# Patient Record
Sex: Female | Born: 1980 | Race: White | Hispanic: No | Marital: Single | Smoking: Current every day smoker
Health system: Southern US, Community
[De-identification: ages and names within clinical notes are randomized; demographics above are authoritative.]

## PROBLEM LIST (undated history)

## (undated) DIAGNOSIS — A630 Anogenital (venereal) warts: Secondary | ICD-10-CM

## (undated) DIAGNOSIS — K219 Gastro-esophageal reflux disease without esophagitis: Secondary | ICD-10-CM

## (undated) DIAGNOSIS — F419 Anxiety disorder, unspecified: Secondary | ICD-10-CM

## (undated) DIAGNOSIS — E079 Disorder of thyroid, unspecified: Secondary | ICD-10-CM

## (undated) DIAGNOSIS — N200 Calculus of kidney: Secondary | ICD-10-CM

## (undated) DIAGNOSIS — B977 Papillomavirus as the cause of diseases classified elsewhere: Secondary | ICD-10-CM

## (undated) DIAGNOSIS — N390 Urinary tract infection, site not specified: Secondary | ICD-10-CM

## (undated) HISTORY — PX: TONSILECTOMY, ADENOIDECTOMY, BILATERAL MYRINGOTOMY AND TUBES: SHX2538

## (undated) HISTORY — PX: CHOLECYSTECTOMY: SHX55

## (undated) HISTORY — DX: Calculus of kidney: N20.0

## (undated) HISTORY — PX: LEEP: SHX91

## (undated) HISTORY — PX: INTRAUTERINE DEVICE INSERTION: SHX323

## (undated) HISTORY — DX: Gastro-esophageal reflux disease without esophagitis: K21.9

## (undated) HISTORY — DX: Urinary tract infection, site not specified: N39.0

## (undated) HISTORY — DX: Papillomavirus as the cause of diseases classified elsewhere: B97.7

## (undated) HISTORY — PX: OTHER SURGICAL HISTORY: SHX169

## (undated) HISTORY — DX: Disorder of thyroid, unspecified: E07.9

## (undated) HISTORY — DX: Anogenital (venereal) warts: A63.0

## (undated) HISTORY — DX: Anxiety disorder, unspecified: F41.9

---

## 1998-03-03 ENCOUNTER — Ambulatory Visit (HOSPITAL_COMMUNITY): Admission: RE | Admit: 1998-03-03 | Discharge: 1998-03-03 | Payer: Self-pay | Admitting: *Deleted

## 1998-11-30 ENCOUNTER — Emergency Department (HOSPITAL_COMMUNITY): Admission: EM | Admit: 1998-11-30 | Discharge: 1998-11-30 | Payer: Self-pay | Admitting: Emergency Medicine

## 2001-02-05 ENCOUNTER — Other Ambulatory Visit: Admission: RE | Admit: 2001-02-05 | Discharge: 2001-02-05 | Payer: Self-pay | Admitting: Gynecology

## 2003-12-18 ENCOUNTER — Emergency Department (HOSPITAL_COMMUNITY): Admission: EM | Admit: 2003-12-18 | Discharge: 2003-12-18 | Payer: Self-pay | Admitting: Emergency Medicine

## 2005-02-03 ENCOUNTER — Emergency Department (HOSPITAL_COMMUNITY): Admission: EM | Admit: 2005-02-03 | Discharge: 2005-02-03 | Payer: Self-pay | Admitting: Emergency Medicine

## 2005-11-10 ENCOUNTER — Emergency Department (HOSPITAL_COMMUNITY): Admission: EM | Admit: 2005-11-10 | Discharge: 2005-11-10 | Payer: Self-pay | Admitting: Emergency Medicine

## 2005-12-28 ENCOUNTER — Ambulatory Visit: Payer: Self-pay | Admitting: Otolaryngology

## 2006-04-23 ENCOUNTER — Emergency Department: Payer: Self-pay | Admitting: General Practice

## 2006-06-18 ENCOUNTER — Emergency Department: Payer: Self-pay | Admitting: Emergency Medicine

## 2006-08-06 ENCOUNTER — Emergency Department (HOSPITAL_COMMUNITY): Admission: EM | Admit: 2006-08-06 | Discharge: 2006-08-06 | Payer: Self-pay | Admitting: Emergency Medicine

## 2006-11-02 IMAGING — CT CT HEAD W/O CM
3 series · 17 of 47 positions shown, 20 images · IV contrast (agent unspecified)
Comparison: 12/18/03.

CLINICAL DATA: Headache, dizziness, ringing in ears. 
HEAD CT WITHOUT CONTRAST:
TECHNIQUE: Contiguous axial images were obtained from the base of the skull through the vertex according to standard protocol without contrast.

[Series 2: head_seq 4.5 h45s st · axial · 0.43mm/px · z∈[+1330,+1469]mm · 11 of 36 slices shown, 14 images]
[im 3/36  brain]
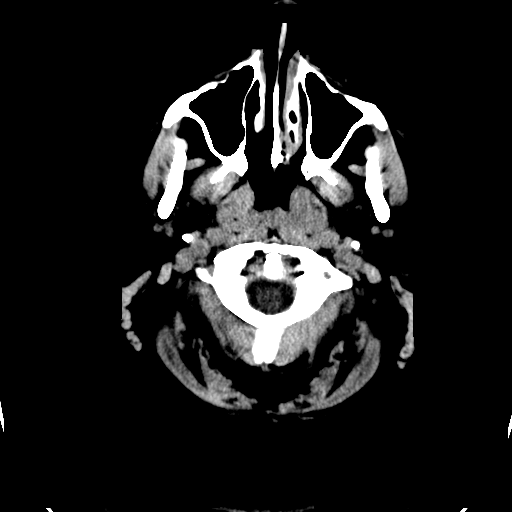
[im 3/36  bone]
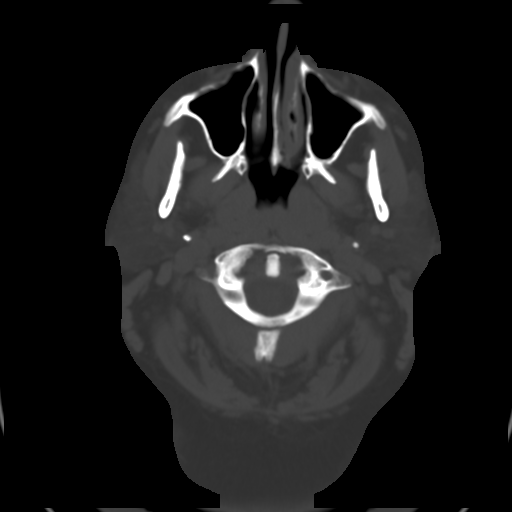
[im 5/36  brain]
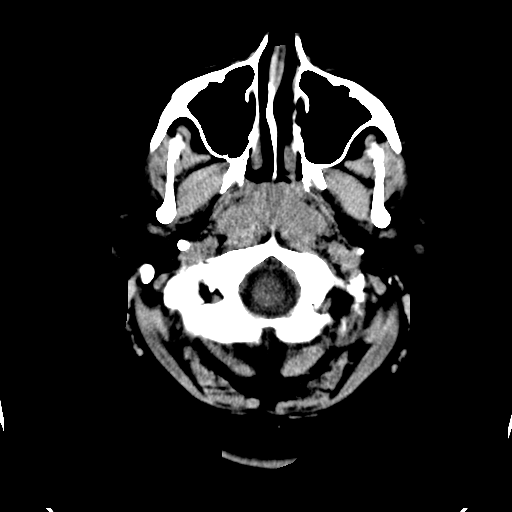
[im 9/36  brain]
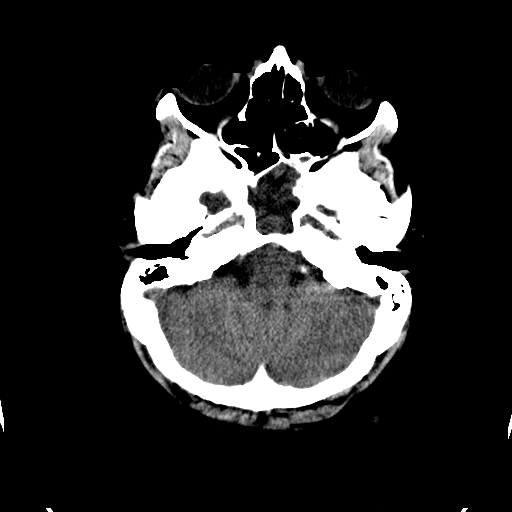
[im 11/36  brain]
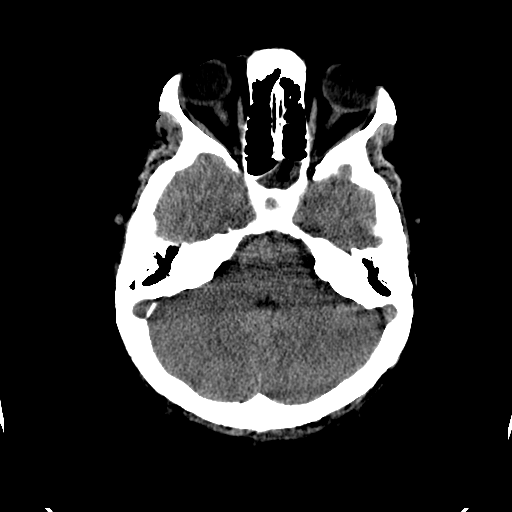
[im 15/36  brain]
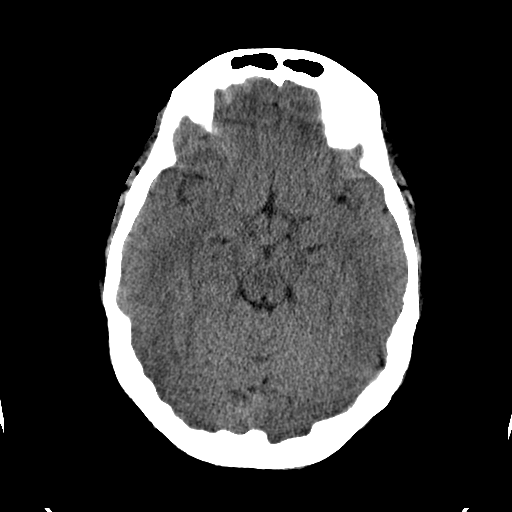
[im 15/36  bone]
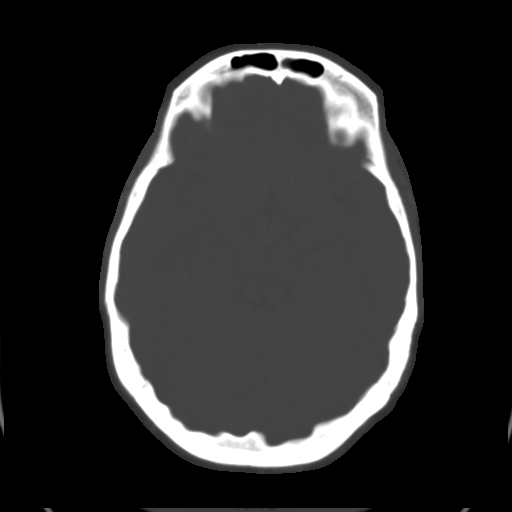
[im 19/36  brain]
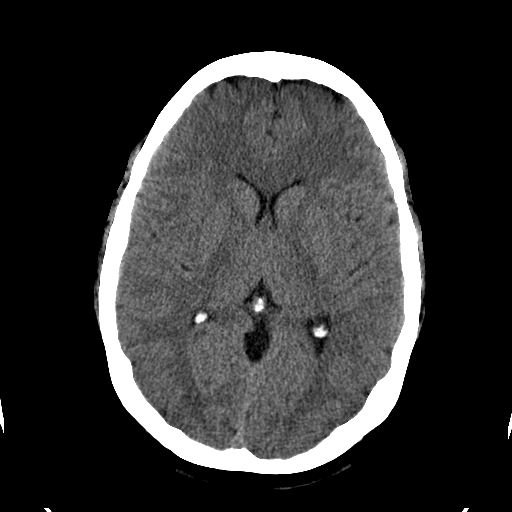
[im 21/36  brain]
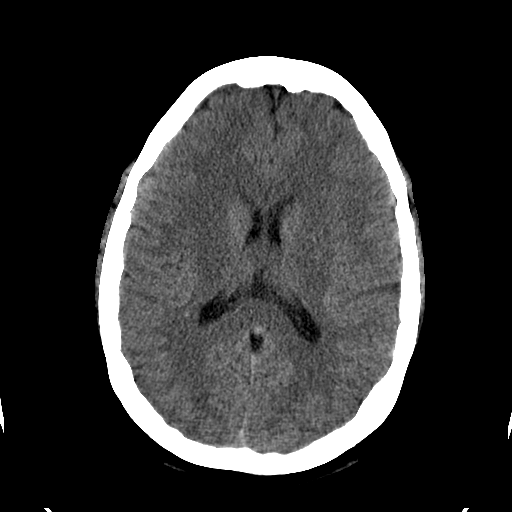
[im 25/36  brain]
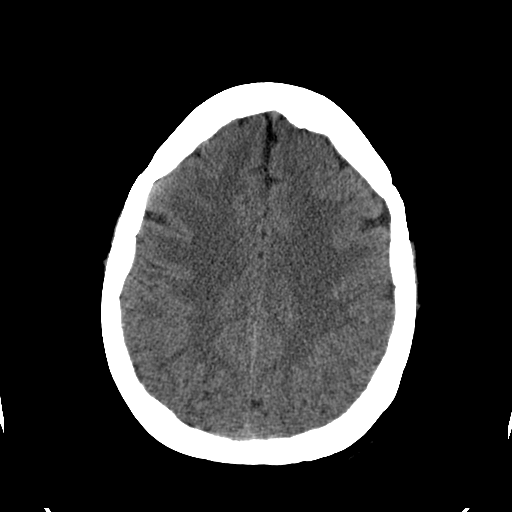
[im 27/36  brain]
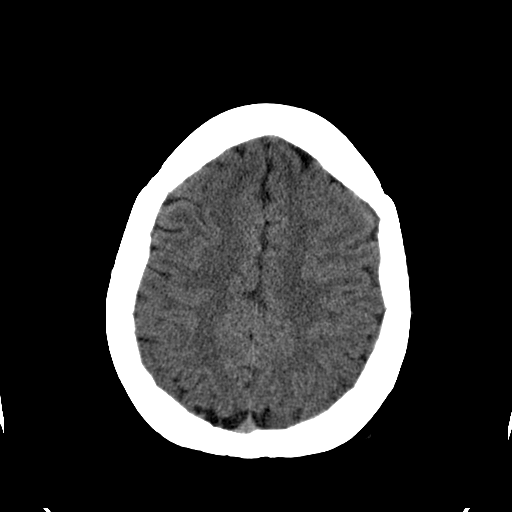
[im 27/36  bone]
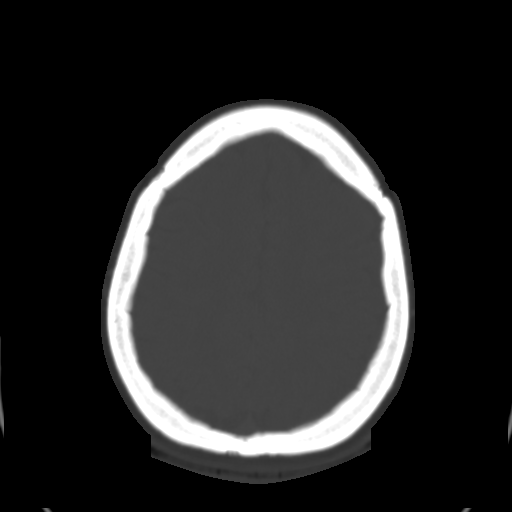
[im 31/36  brain]
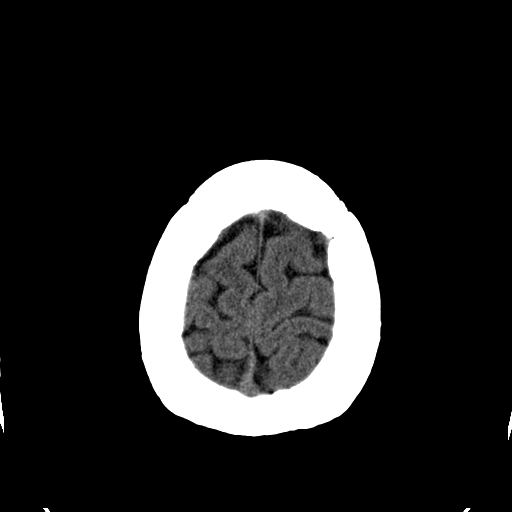
[im 33/36  brain]
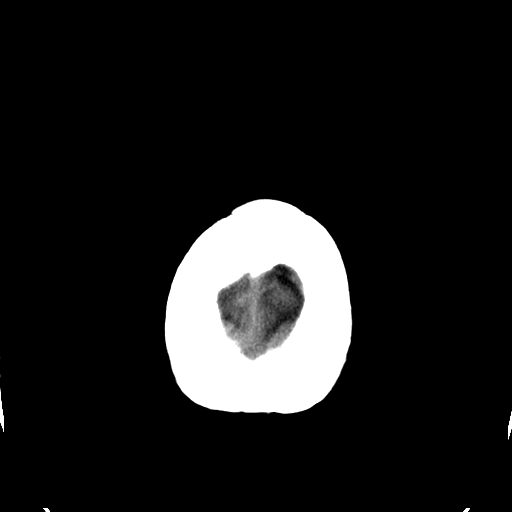

[Series 602: coronal · coronal · 0.51mm/px · 3 of 39 slices shown]
[im 13/39  brain]
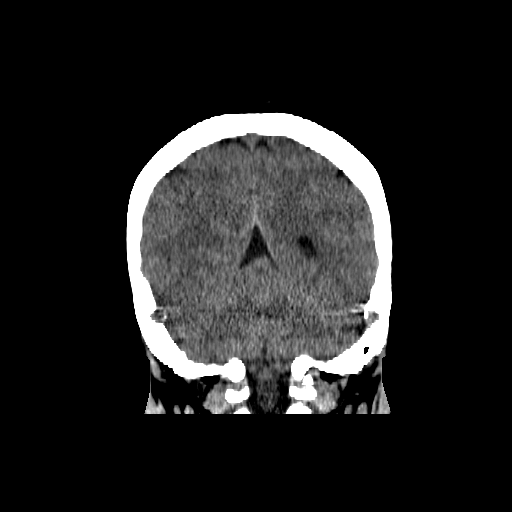
[im 17/39  brain]
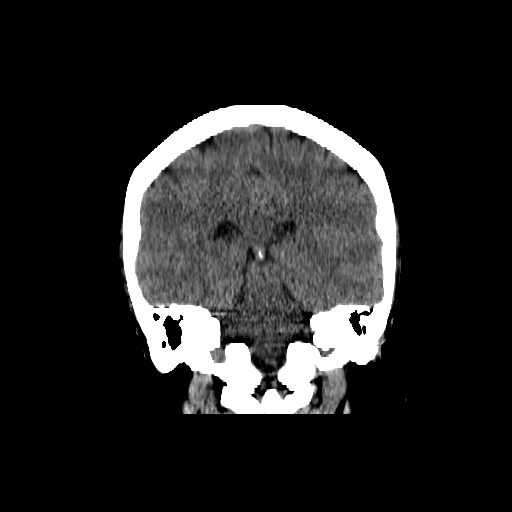
[im 22/39  brain]
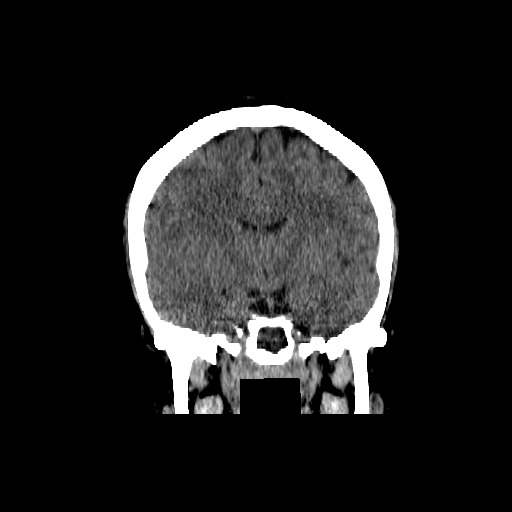

[Series 603: sagittal · sagittal · 0.51mm/px · 3 of 39 slices shown]
[im 13/39  brain]
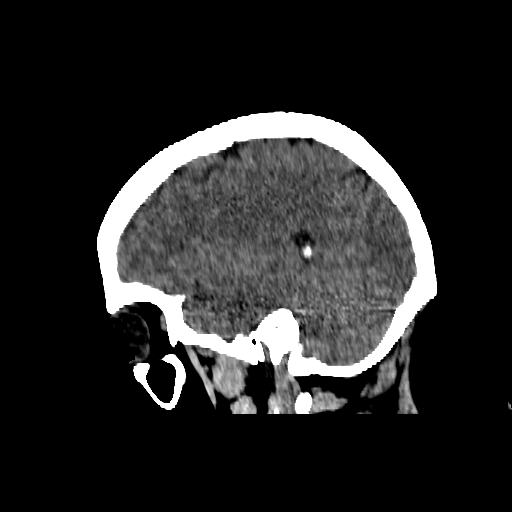
[im 20/39  brain]
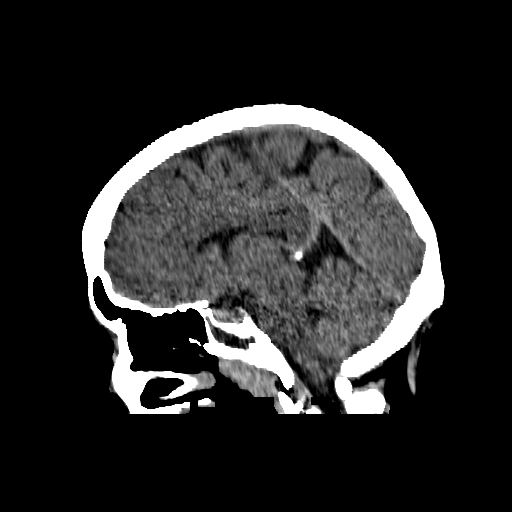
[im 26/39  brain]
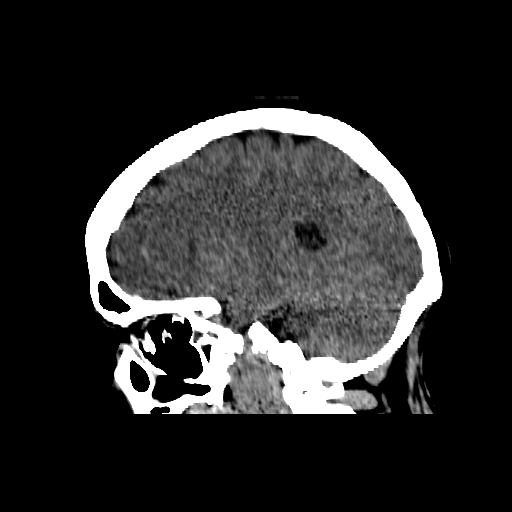

[17 of 47 positions shown; findings below may reference images not displayed]

FINDINGS: There is no evidence of intracranial abnormality including mass or mass effect, hydrocephalus, extraaxial fluid collection, midline shift, hemorrhage, or infarct.  Acute infarct may be missed by CT for 24 to 48 hours.   There has been interval near complete opacification and moderate mucosal thickening of the sphenoid sinus with fluid in the left middle and inner ears.
IMPRESSION: 1.  No evidence of acute intracranial abnormality. 
2.  Sphenoid sinusitis with fluid in the left middle and inner ears.

## 2007-04-15 IMAGING — CR RIGHT FOOT COMPLETE - 3+ VIEW
1 series · 3 of 3 positions shown · non-contrast
Comparison: none

REASON FOR EXAM: injury
COMMENTS:

[Series 1: view not recorded · 0.17mm/px · 3 of 3 slices shown]
[im 1/3]
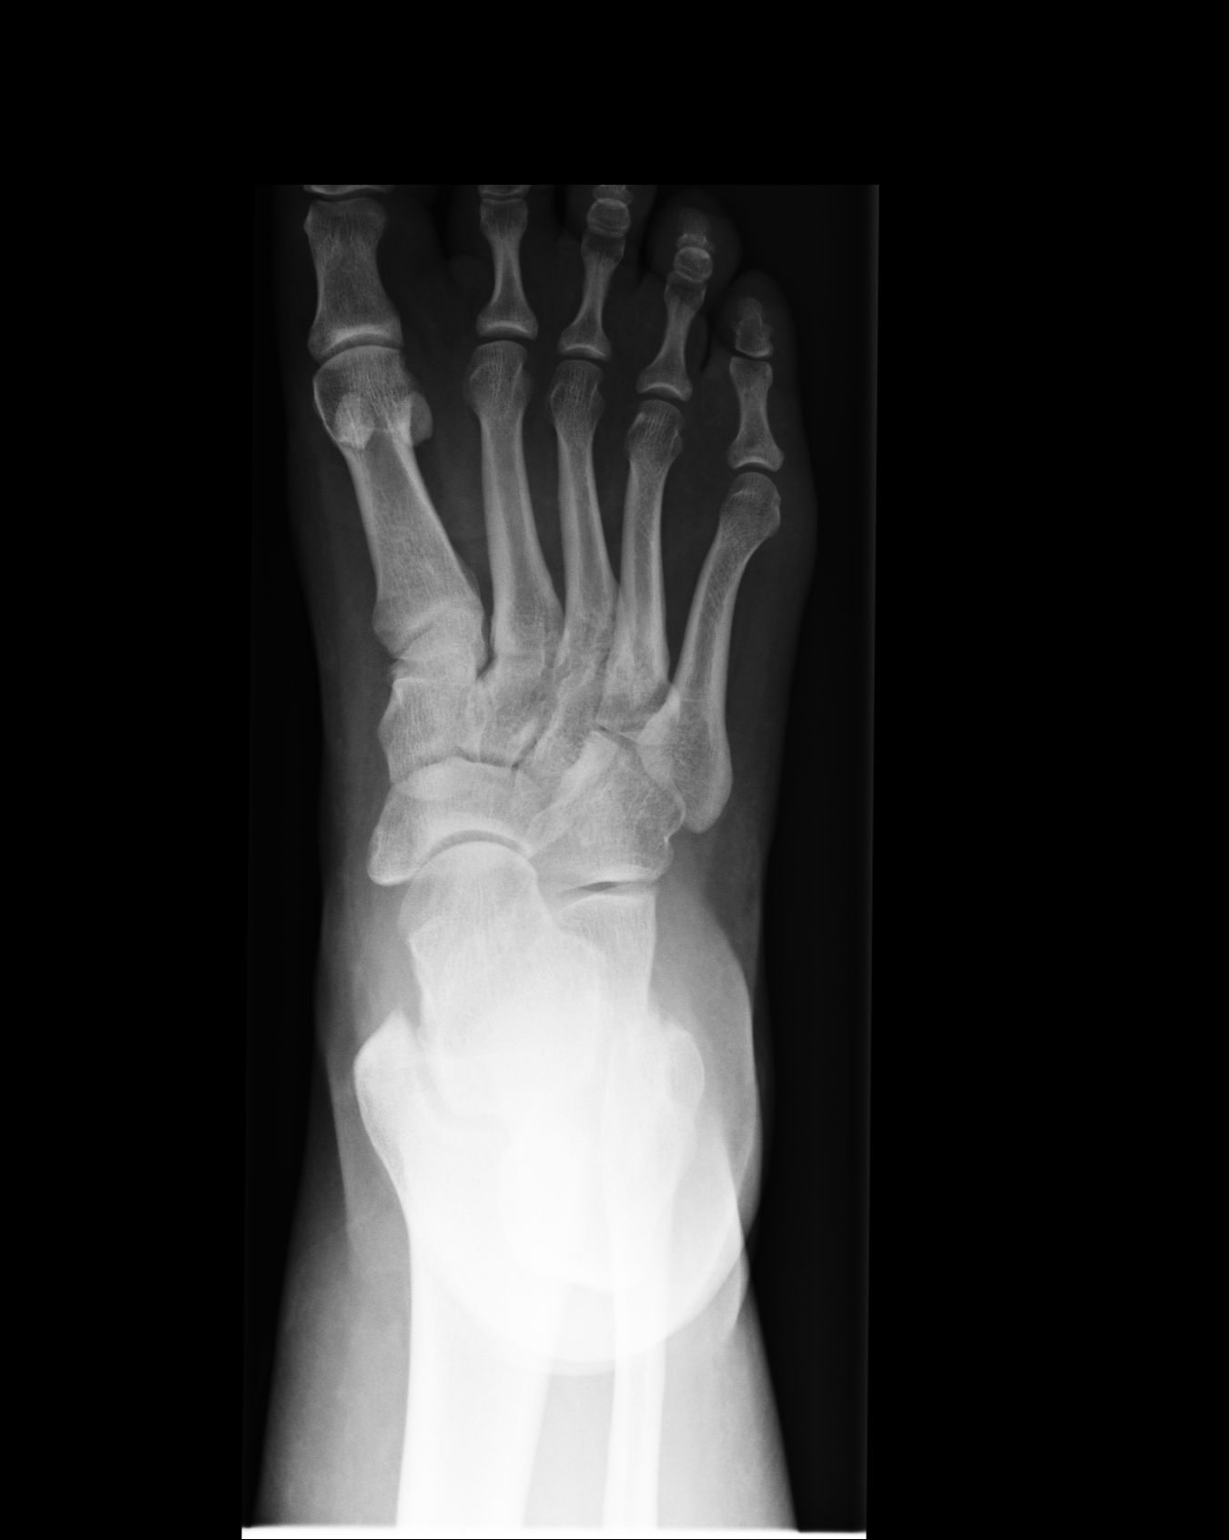
[im 2/3]
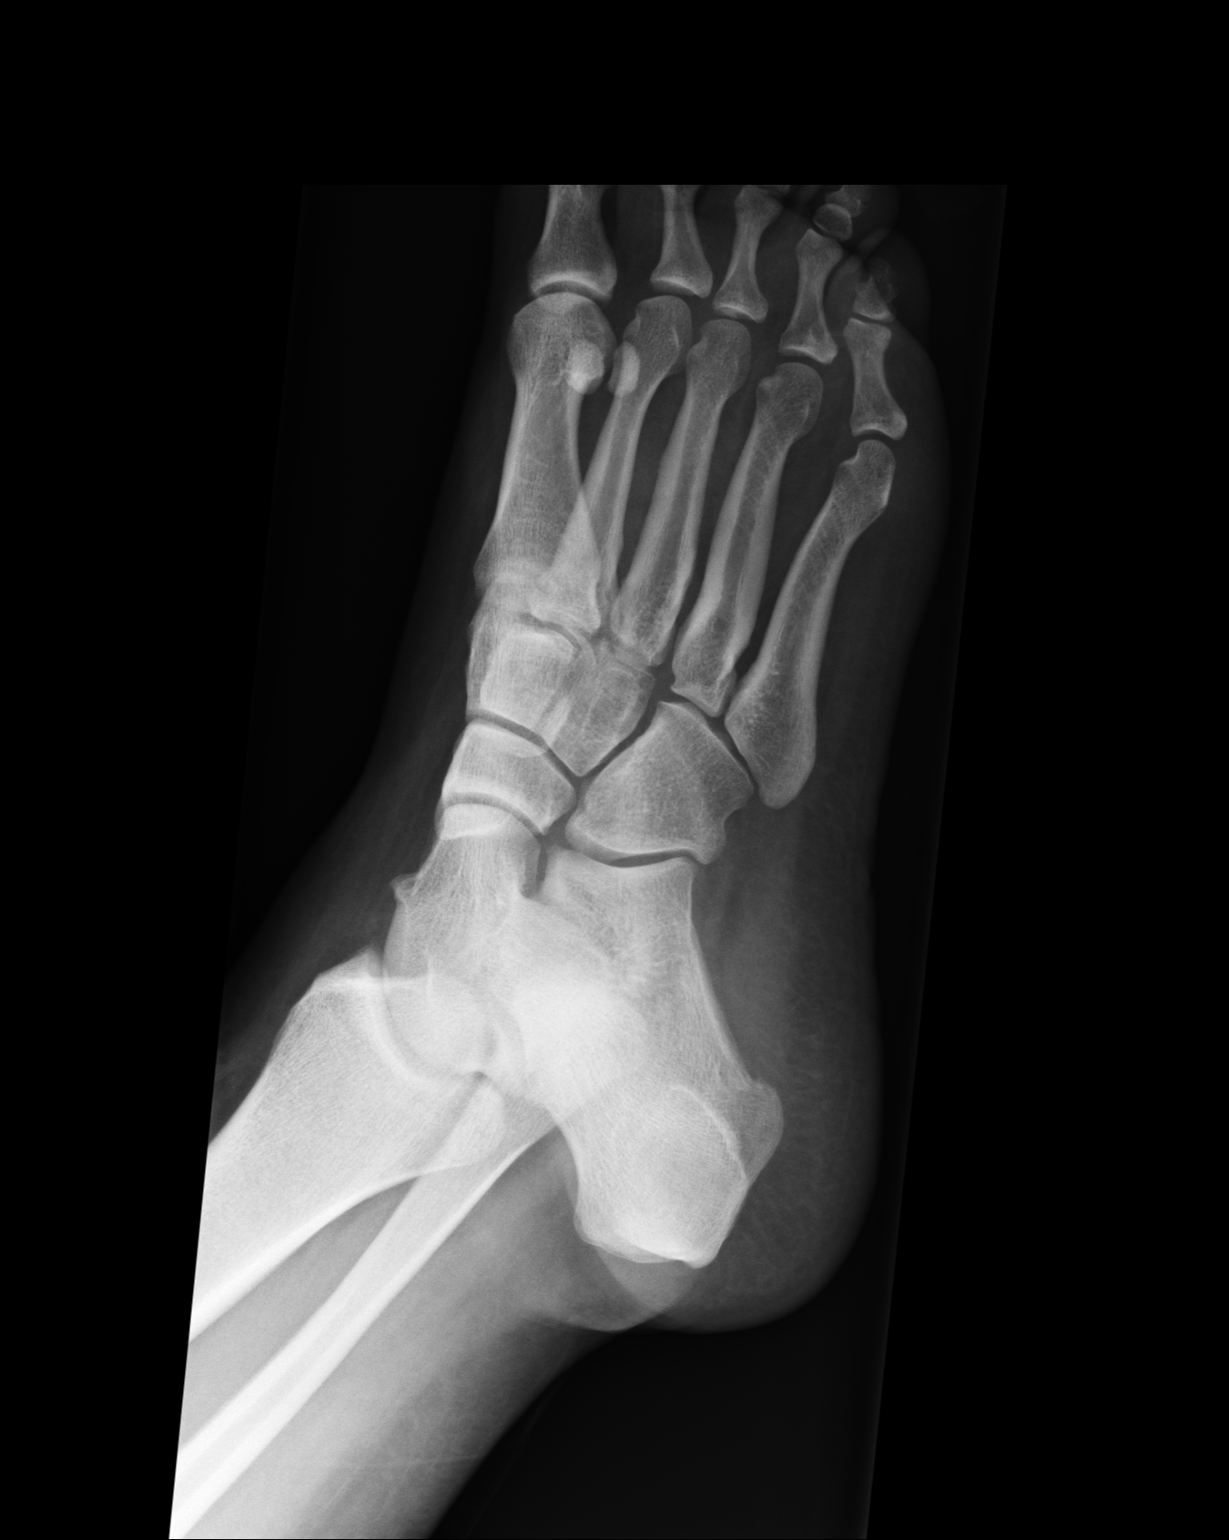
[im 3/3]
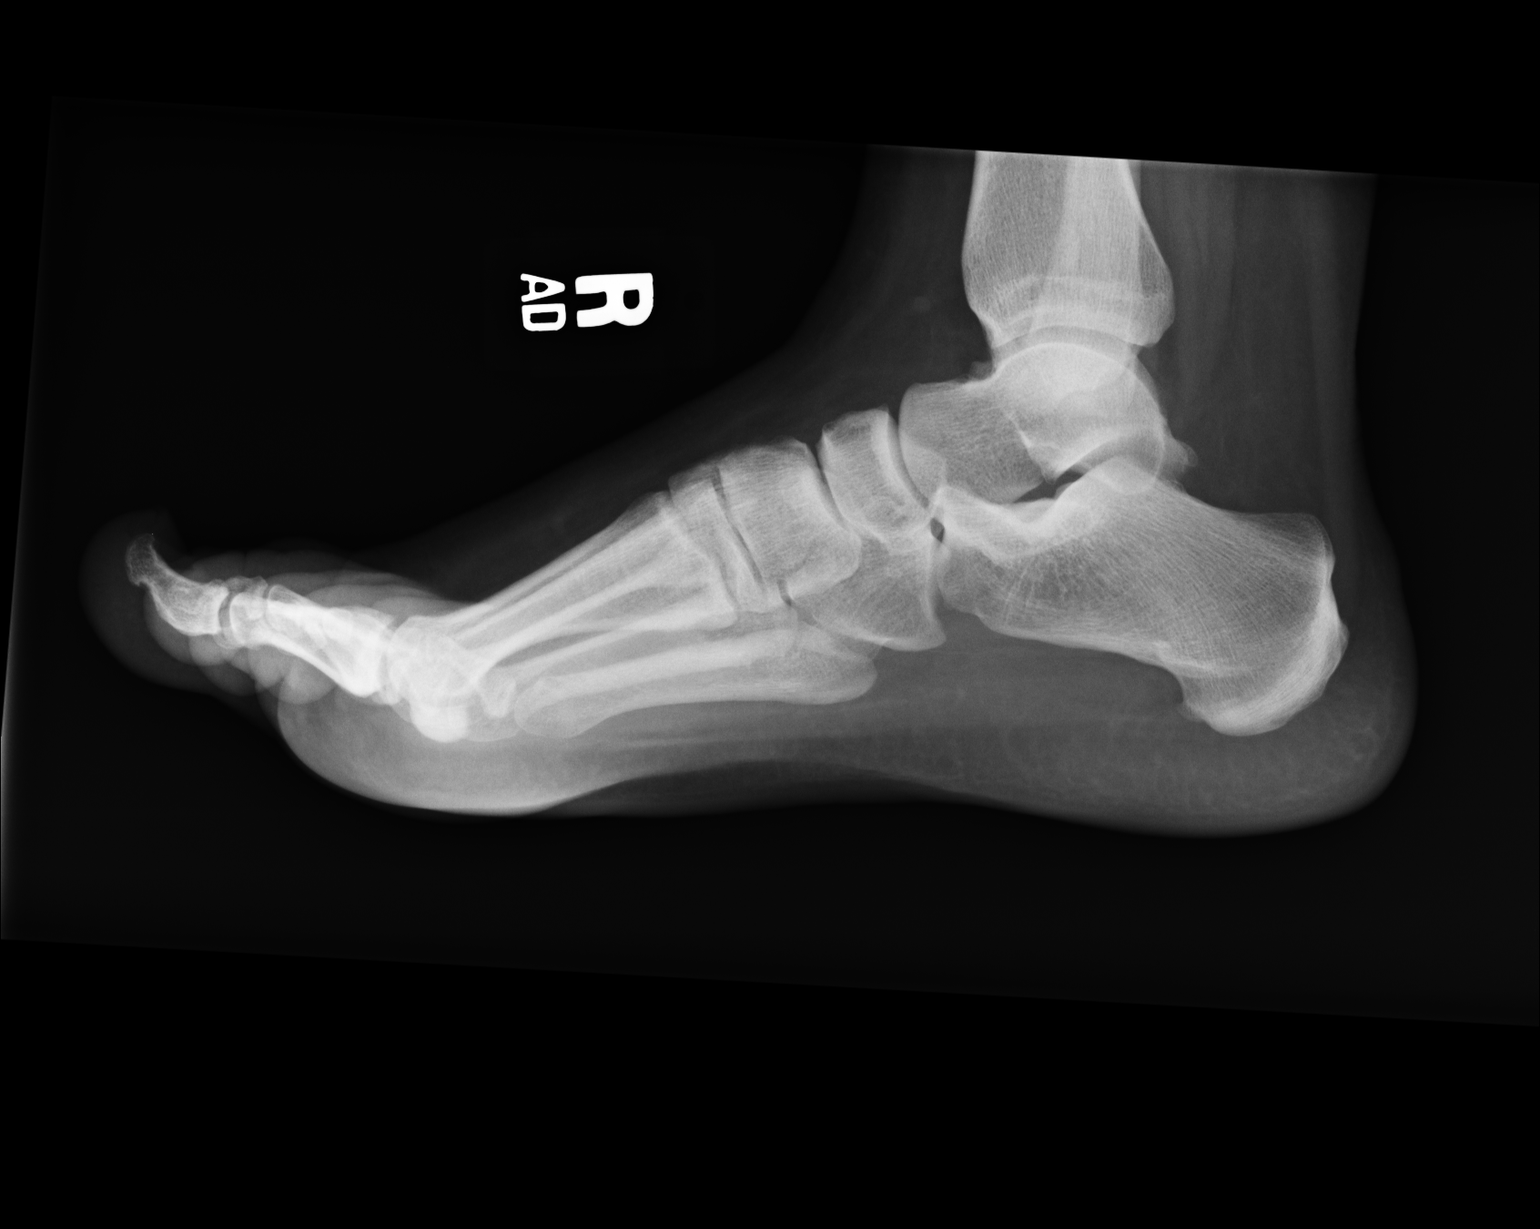

[3 of 3 positions shown; findings below may reference images not displayed]

PROCEDURE:     DXR - DXR FOOT RT COMPLETE W/OBLIQUES  - April 23, 2006  [DATE]

RESULT:          There does not appear to be evidence of a definitive
fracture or dislocation, though a radio occult fracture cannot be excluded.
A repeat evaluation can be obtained if clinically warranted, and if
clinically warranted, immobilization of the patient's foot is recommended.
There does appear to be an element of soft tissue swelling along the lateral
distal fifth metatarsal.
IMPRESSION: No evidence of a definitive fracture as described
above.  Repeat surveillance evaluation is recommended if clinically
warranted.

## 2007-07-29 IMAGING — CR DG CHEST 2V
2 series · 2 of 2 positions shown · non-contrast
Comparison: none

CLINICAL DATA: Chest pain.  
 CHEST - 2 VIEW:

[w chest pa]
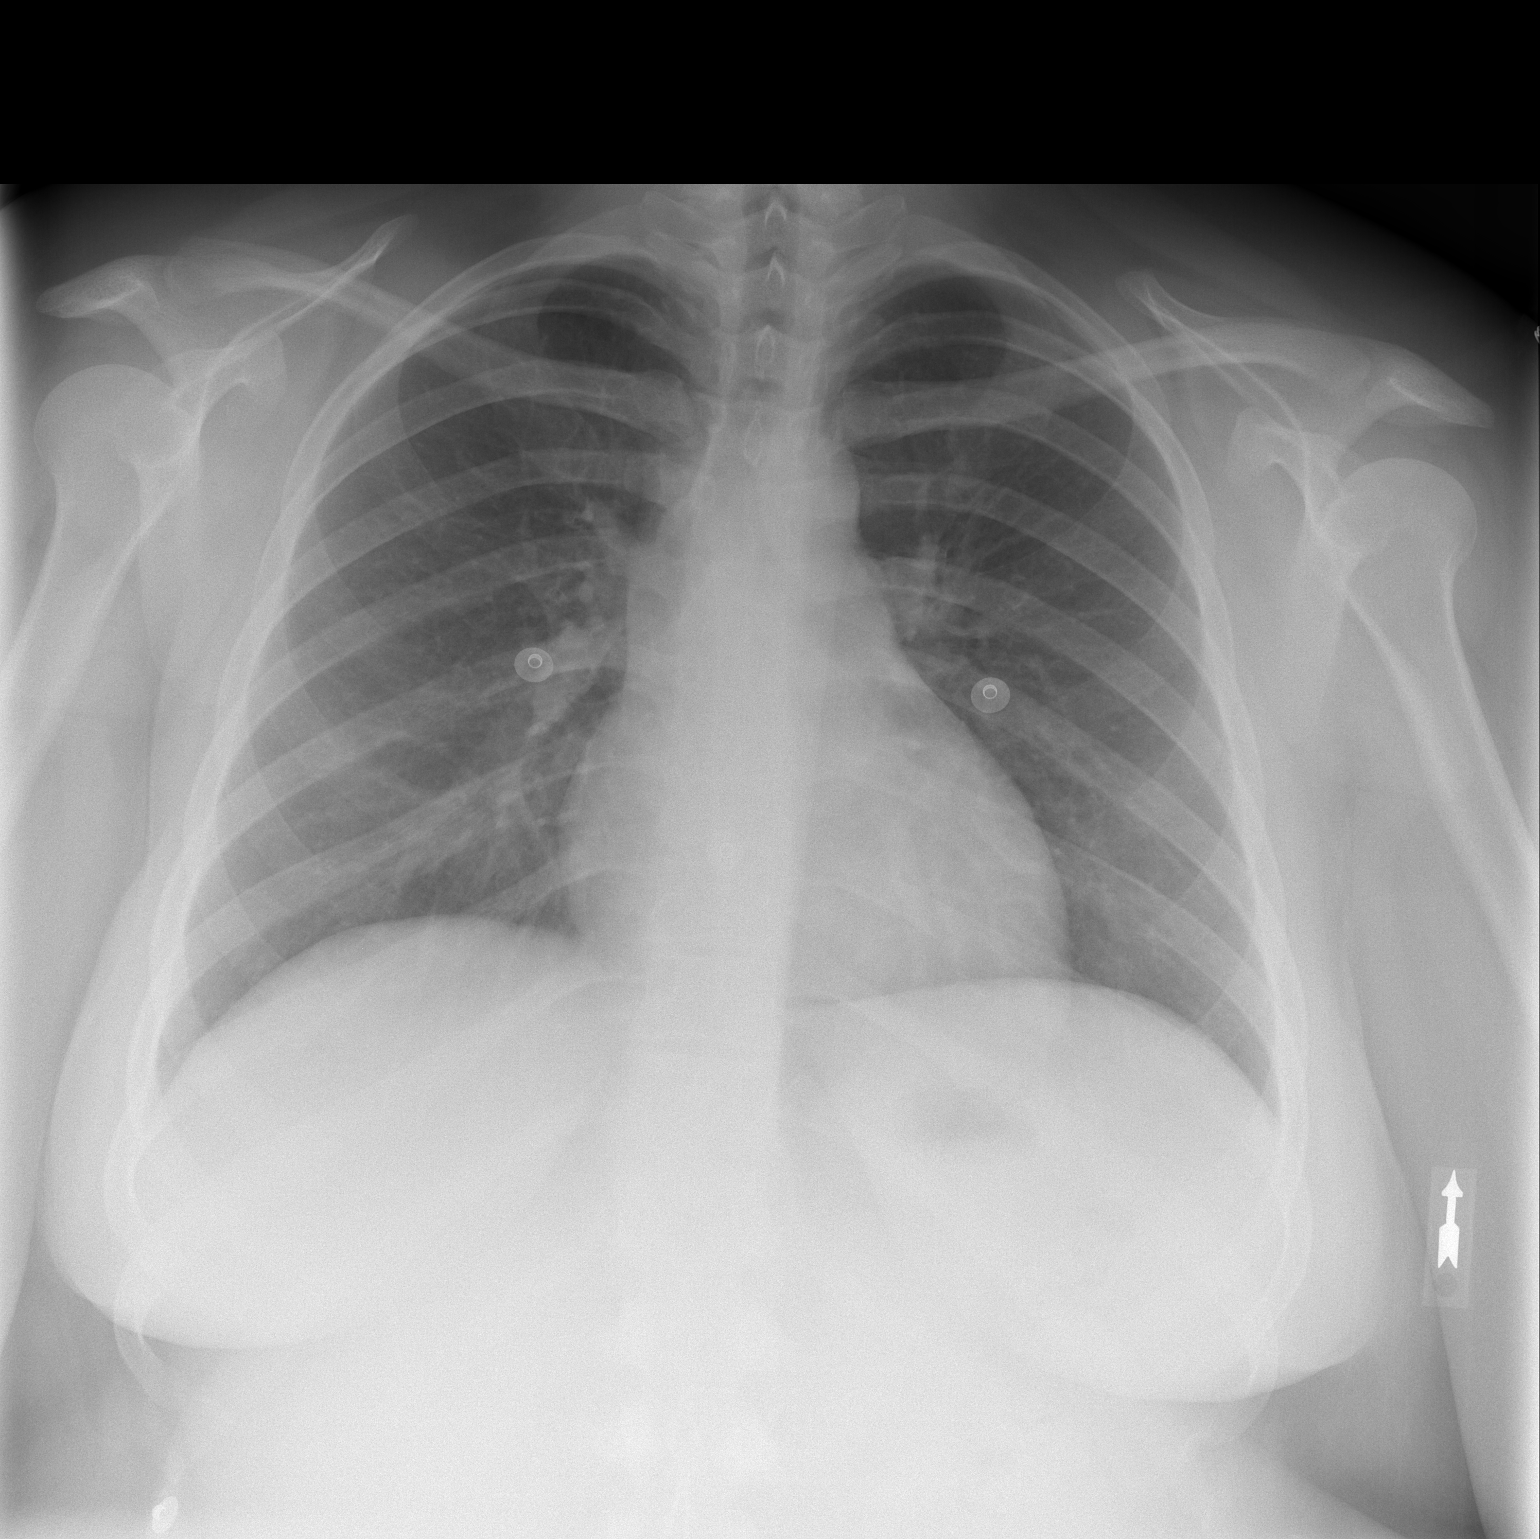

[w chest lat]
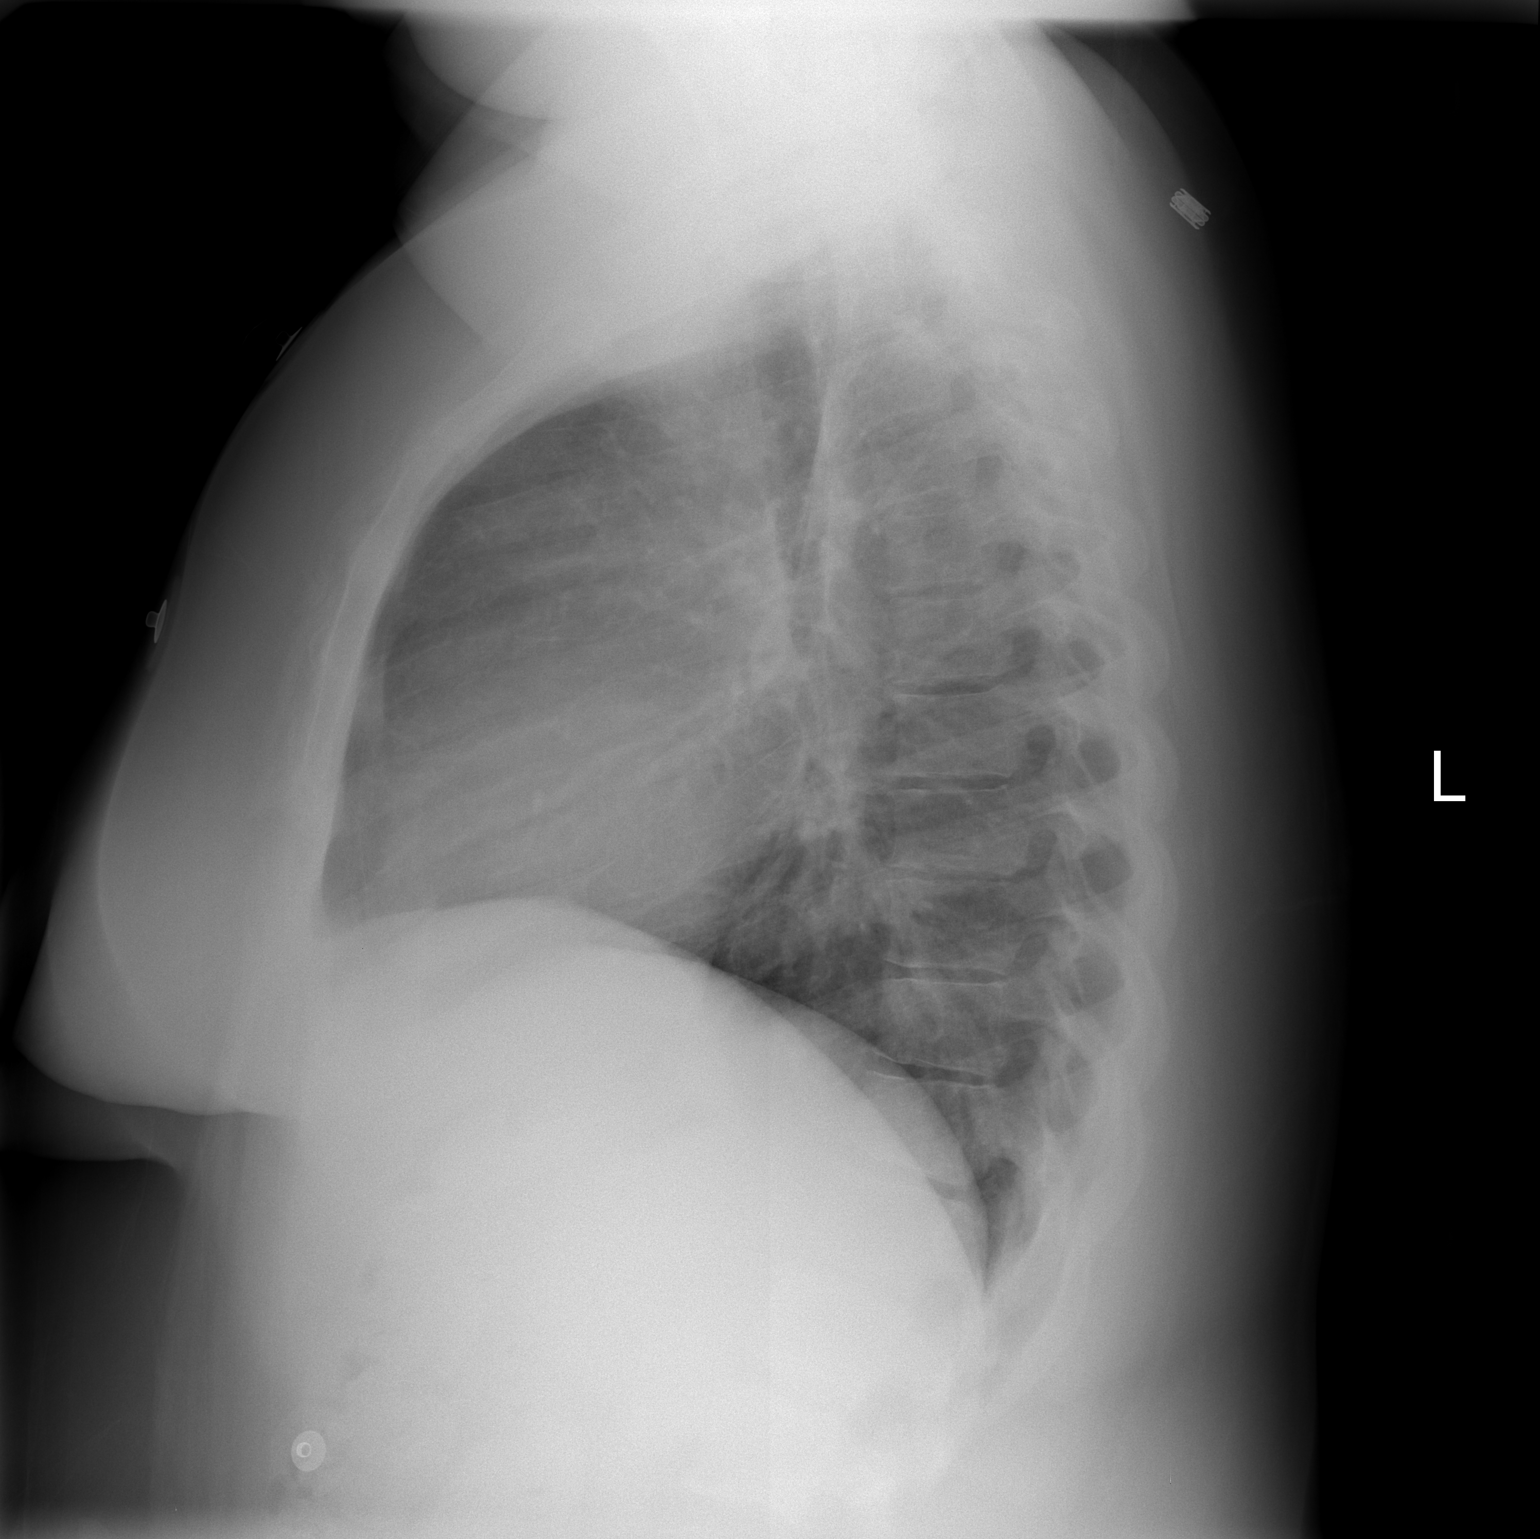

[2 of 2 positions shown; findings below may reference images not displayed]

FINDINGS: The heart size and mediastinal contours are within normal limits.  Both lungs are clear.  The visualized skeletal structures are unremarkable.
IMPRESSION: No active cardiopulmonary disease.

## 2008-01-27 ENCOUNTER — Emergency Department (HOSPITAL_COMMUNITY): Admission: EM | Admit: 2008-01-27 | Discharge: 2008-01-27 | Payer: Self-pay | Admitting: Family Medicine

## 2009-07-20 ENCOUNTER — Emergency Department (HOSPITAL_COMMUNITY): Admission: EM | Admit: 2009-07-20 | Discharge: 2009-07-20 | Payer: Self-pay | Admitting: Emergency Medicine

## 2009-09-14 ENCOUNTER — Other Ambulatory Visit: Admission: RE | Admit: 2009-09-14 | Discharge: 2009-09-14 | Payer: Self-pay | Admitting: Family Medicine

## 2010-02-23 ENCOUNTER — Other Ambulatory Visit: Admission: RE | Admit: 2010-02-23 | Discharge: 2010-02-23 | Payer: Self-pay | Admitting: Obstetrics and Gynecology

## 2011-08-08 LAB — POCT RAPID STREP A: Streptococcus, Group A Screen (Direct): NEGATIVE

## 2011-08-08 LAB — INFLUENZA A AND B ANTIGEN (CONVERTED LAB): Influenza B Ag: NEGATIVE

## 2016-02-09 ENCOUNTER — Encounter: Payer: Self-pay | Admitting: Family

## 2016-02-10 ENCOUNTER — Telehealth: Payer: Self-pay | Admitting: Family

## 2016-02-10 NOTE — Telephone Encounter (Signed)
Ok to work in sooner in any 30 minute spot.

## 2016-02-10 NOTE — Telephone Encounter (Signed)
Patient is trying to establish as new patient.  She is out of thyroid meds - synthroid  - 200mg  6 days a week and 400mg  1 day a week.  Patient states previous MD already gave her all the refills they can do.  Patient had appointment scheduled for tomorrow but had to reschedule because found out insurance does not start until April 1st.  Would like to know [facility] [address] or if she can be worked in sooner?

## 2016-02-11 ENCOUNTER — Ambulatory Visit: Payer: Self-pay | Admitting: Family

## 2016-02-12 NOTE — Telephone Encounter (Signed)
Got patient scheduled

## 2016-02-17 ENCOUNTER — Other Ambulatory Visit (INDEPENDENT_AMBULATORY_CARE_PROVIDER_SITE_OTHER): Payer: No Typology Code available for payment source

## 2016-02-17 ENCOUNTER — Encounter: Payer: Self-pay | Admitting: Family

## 2016-02-17 ENCOUNTER — Telehealth: Payer: Self-pay | Admitting: Family

## 2016-02-17 ENCOUNTER — Ambulatory Visit (INDEPENDENT_AMBULATORY_CARE_PROVIDER_SITE_OTHER): Payer: No Typology Code available for payment source | Admitting: Family

## 2016-02-17 VITALS — BP 114/70 | HR 79 | Temp 98.2°F | Resp 16 | Wt 239.0 lb

## 2016-02-17 DIAGNOSIS — K219 Gastro-esophageal reflux disease without esophagitis: Secondary | ICD-10-CM | POA: Insufficient documentation

## 2016-02-17 DIAGNOSIS — F909 Attention-deficit hyperactivity disorder, unspecified type: Secondary | ICD-10-CM | POA: Diagnosis not present

## 2016-02-17 DIAGNOSIS — E039 Hypothyroidism, unspecified: Secondary | ICD-10-CM | POA: Diagnosis not present

## 2016-02-17 DIAGNOSIS — F411 Generalized anxiety disorder: Secondary | ICD-10-CM

## 2016-02-17 DIAGNOSIS — K2100 Gastro-esophageal reflux disease with esophagitis, without bleeding: Secondary | ICD-10-CM

## 2016-02-17 DIAGNOSIS — Z0000 Encounter for general adult medical examination without abnormal findings: Secondary | ICD-10-CM

## 2016-02-17 LAB — T4: T4, Total: 10.2 ug/dL (ref 4.5–12.0)

## 2016-02-17 LAB — TSH: TSH: 0.58 u[IU]/mL (ref 0.35–4.50)

## 2016-02-17 MED ORDER — LEVOTHYROXINE SODIUM 200 MCG PO TABS: 200.0000 ug | ORAL_TABLET | Freq: Every day | ORAL | Status: DC

## 2016-02-17 MED ORDER — OMEPRAZOLE 20 MG PO CPDR: 20.0000 mg | DELAYED_RELEASE_CAPSULE | Freq: Every day | ORAL | Status: DC

## 2016-02-17 MED ORDER — CITALOPRAM HYDROBROMIDE 20 MG PO TABS: 20.0000 mg | ORAL_TABLET | Freq: Every day | ORAL | Status: DC

## 2016-02-17 NOTE — Assessment & Plan Note (Signed)
Symptoms of GERD adequately controlled with current regimen with no adverse side effects. Continue current dosage of omeprazole. Follow-up if symptoms are no longer controlled or worsen.

## 2016-02-17 NOTE — Assessment & Plan Note (Signed)
Generalized anxiety disorder currently maintained with Celexa and alprazolam. Reports no adverse side effects and a 30 day supply of alprazolam has lasted approximately one year. Symptoms appear to be adequately controlled with current regimen. Continue current dosage of Celexa and alprazolam. Follow-up in 3 months or sooner if needed.

## 2016-02-17 NOTE — Assessment & Plan Note (Signed)
Obtain TSH, T3, and T4 to determine current status of thyroid function. Continue current dosage of levothyroxine with 200 g daily for 6 days and 400 g once per week. Follow-up and changes pending TSH, T3, and T4 results.

## 2016-02-17 NOTE — Progress Notes (Signed)
Subjective:    Patient ID: Lisa Mayer, female    DOB: 06/01/1981, 35 y.o.   MRN: [id number]  Chief Complaint  Patient presents with  . Establish Care    TSH check    HPI:  [name] is a 35 y.o. female who  has a past medical history of Urinary tract infection; Genital warts; GERD (gastroesophageal reflux disease); Kidney stones; Thyroid disease; ADHD (attention deficit hyperactivity disorder); HPV (human papilloma virus) infection; and Anxiety. and presents today for an office visit to establish care.   1.) Thyroid disease - Currently maintained on levothyroxine. Reports taking the medication as prescribed and denies adverse side effects. Notes symptoms are well controlled with current dosage of medication. Denies temperature intolerance or changes to skin, hair or nails. No fatigue.   2.) Anxiety - Currently maintained on Celexa and Xanax. Reports taking the medication as prescribed and denies adverse side effects. Notes her symptoms are adequately controlled. Currently using the Xanax infrequently.   3.) GERD - Currently maintained on the omeprazole. Reports taking the medication as prescribed and denies adverse side effects. Symptoms are well controlled with the current regimen.   No Known Allergies   No outpatient prescriptions prior to visit.   No facility-administered medications prior to visit.     Past Medical History  Diagnosis Date  . Urinary tract infection   . Genital warts   . GERD (gastroesophageal reflux disease)   . Kidney stones   . Thyroid disease   . ADHD (attention deficit hyperactivity disorder)   . HPV (human papilloma virus) infection   . Anxiety      Past Surgical History  Procedure Laterality Date  . Gastric bypass    . Cholecystectomy    . Tonsilectomy, adenoidectomy, bilateral myringotomy and tubes       Family History  Problem Relation Age of Onset  . Hyperlipidemia Mother   . Hypertension Mother   . Hyperlipidemia Father    . Hypertension Father   . ALS Maternal Grandmother   . Cancer Maternal Grandfather   . Lung cancer Paternal Grandmother   . Cancer Paternal Grandfather      Social History   Social History  . Marital Status: Single    Spouse Name: N/A  . Number of Children: 0  . Years of Education: 14   Occupational History  . EMS    Social History Main Topics  . Smoking status: Current Every Day Smoker -- 1.00 packs/day for 22 years    Types: Cigarettes  . Smokeless tobacco: Not on file  . Alcohol Use: 0.0 oz/week    0 Standard drinks or equivalent per week     Comment: Occasionally  . Drug Use: No  . Sexual Activity: Not on file   Other Topics Concern  . Not on file   Social History Narrative   Fun: Read, meet people   Denies abuse and feels safe at home.       Review of Systems  Constitutional: Negative for fever and chills.  Respiratory: Negative for chest tightness and shortness of breath.   Cardiovascular: Negative for chest pain, palpitations and leg swelling.  Gastrointestinal: Negative for nausea, vomiting, abdominal pain, diarrhea and constipation.  Endocrine: Negative for cold intolerance and heat intolerance.      Objective:    BP 114/70 mmHg  Pulse 79  Temp(Src) 98.2 F (36.8 C) (Oral)  Resp 16  Wt 239 lb (108.41 kg)  SpO2 99% Nursing note and vital  signs reviewed.  Physical Exam  Constitutional: She is oriented to person, place, and time. She appears well-developed and well-nourished. No distress.  Neck: No thyromegaly present.  Cardiovascular: Normal rate, regular rhythm, normal heart sounds and intact distal pulses.   Pulmonary/Chest: Effort normal and breath sounds normal.  Lymphadenopathy:    She has no cervical adenopathy.  Neurological: She is alert and oriented to person, place, and time.  Skin: Skin is warm and dry.  Psychiatric: She has a normal mood and affect. Her behavior is normal. Judgment and thought content normal.       Assessment  & Plan:   Problem List Items Addressed This Visit      Digestive   GERD (gastroesophageal reflux disease) - Primary    Symptoms of GERD adequately controlled with current regimen with no adverse side effects. Continue current dosage of omeprazole. Follow-up if symptoms are no longer controlled or worsen.      Relevant Medications   omeprazole (PRILOSEC) 20 MG capsule     Endocrine   Hypothyroidism    Obtain TSH, T3, and T4 to determine current status of thyroid function. Continue current dosage of levothyroxine with 200 g daily for 6 days and 400 g once per week. Follow-up and changes pending TSH, T3, and T4 results.      Relevant Medications   levothyroxine (SYNTHROID, LEVOTHROID) 200 MCG tablet   Other Relevant Orders   TSH   T3   T4     Other   Generalized anxiety disorder    Generalized anxiety disorder currently maintained with Celexa and alprazolam. Reports no adverse side effects and a 30 day supply of alprazolam has lasted approximately one year. Symptoms appear to be adequately controlled with current regimen. Continue current dosage of Celexa and alprazolam. Follow-up in 3 months or sooner if needed.      Relevant Medications   citalopram (CELEXA) 20 MG tablet   Attention deficit hyperactivity disorder (ADHD)    Other Visit Diagnoses    Routine general medical examination at a health care facility        Relevant Orders    Ambulatory referral to Gynecology       I have changed Ms. Isidro's levothyroxine, omeprazole, and citalopram. I am also having her maintain her ALPRAZolam and Etonogestrel (NEXPLANON Secretary).   Meds ordered this encounter  Medications  . DISCONTD: levothyroxine (SYNTHROID, LEVOTHROID) 200 MCG tablet    Sig: Take 200 mcg by mouth daily before breakfast. 200 mcg 6x per week and 400 mcg 1x per week  . DISCONTD: omeprazole (PRILOSEC) 20 MG capsule    Sig: Take 20 mg by mouth daily.  Marland Kitchen DISCONTD: citalopram (CELEXA) 20 MG tablet    Sig: Take 20  mg by mouth daily.  Marland Kitchen ALPRAZolam (XANAX) 1 MG tablet    Sig: Take 1 mg by mouth at bedtime as needed for anxiety.  . Etonogestrel (NEXPLANON Benedict)    Sig: Inject into the skin.  Marland Kitchen levothyroxine (SYNTHROID, LEVOTHROID) 200 MCG tablet    Sig: Take 1 tablet (200 mcg total) by mouth daily before breakfast. 200 mcg 6x per week and 400 mcg 1x per week    Dispense:  34 tablet    Refill:  0    Order Specific Question:  Supervising Provider    Answer:  Hillard Danker A [[id number]]  . omeprazole (PRILOSEC) 20 MG capsule    Sig: Take 1 capsule (20 mg total) by mouth daily.    Dispense:  90 capsule  Refill:  3    Order Specific Question:  Supervising Provider    Answer:  Hillard DankerRAWFORD, ELIZABETH A [[id number]]  . citalopram (CELEXA) 20 MG tablet    Sig: Take 1 tablet (20 mg total) by mouth daily.    Dispense:  90 tablet    Refill:  1    Order Specific Question:  Supervising Provider    Answer:  Hillard DankerRAWFORD, ELIZABETH A [[id number]]     Follow-up: Return in about 1 month (around 03/18/2016).  Jeanine Luzalone, Gregory, FNP

## 2016-02-17 NOTE — Progress Notes (Signed)
Pre visit review using our clinic review tool, if applicable. No additional management support is needed unless otherwise documented below in the visit note. 

## 2016-02-17 NOTE — Patient Instructions (Signed)
Thank you for choosing [facility].  Summary/Instructions:  Continue to take your medications as prescribed.   We will obtain records for your ADHD.   Your prescription(s) have been submitted to your pharmacy or been printed and provided for you. Please take as directed and contact our office if you believe you are having problem(s) with the medication(s) or have any questions.  Please stop by the lab on the basement level of the building for your blood work. Your results will be released to MyChart (or called to you) after review, usually within 72 hours after test completion. If any changes need to be made, you will be notified at that same time.

## 2016-02-17 NOTE — Telephone Encounter (Signed)
Please inform patient that her TSH is 0.58 and within the normal ranges and therefore no changes are needed in her thyroid medication at this time. If she would like she can decrease her medication to 200 mcg daily.

## 2016-02-18 LAB — T3: T3, Total: 114 ng/dL (ref 76–181)

## 2016-02-19 NOTE — Telephone Encounter (Signed)
Pt aware of results 

## 2016-02-25 ENCOUNTER — Encounter: Payer: Self-pay | Admitting: Women's Health

## 2016-02-25 ENCOUNTER — Ambulatory Visit (INDEPENDENT_AMBULATORY_CARE_PROVIDER_SITE_OTHER): Payer: No Typology Code available for payment source | Admitting: Women's Health

## 2016-02-25 VITALS — BP 118/80 | Ht 65.0 in | Wt 229.0 lb

## 2016-02-25 DIAGNOSIS — Z01419 Encounter for gynecological examination (general) (routine) without abnormal findings: Secondary | ICD-10-CM

## 2016-02-25 DIAGNOSIS — B009 Herpesviral infection, unspecified: Secondary | ICD-10-CM

## 2016-02-25 DIAGNOSIS — B977 Papillomavirus as the cause of diseases classified elsewhere: Secondary | ICD-10-CM | POA: Diagnosis not present

## 2016-02-25 MED ORDER — VALACYCLOVIR HCL 500 MG PO TABS: 500.0000 mg | ORAL_TABLET | Freq: Two times a day (BID) | ORAL | Status: AC

## 2016-02-25 MED ORDER — IMIQUIMOD 5 % EX CREA: TOPICAL_CREAM | CUTANEOUS | Status: AC

## 2016-02-25 NOTE — Patient Instructions (Signed)
Health Maintenance, Female Adopting a healthy lifestyle and getting preventive care can go a long way to promote health and wellness. Talk with your health care provider about what schedule of regular examinations is right for you. This is a good chance for you to check in with your provider about disease prevention and staying healthy. In between checkups, there are plenty of things you can do on your own. Experts have done a lot of research about which lifestyle changes and preventive measures are most likely to keep you healthy. Ask your health care provider for more information. WEIGHT AND DIET  Eat a healthy diet  Be sure to include plenty of vegetables, fruits, low-fat dairy products, and lean protein.  Do not eat a lot of foods high in solid fats, added sugars, or salt.  Get regular exercise. This is one of the most important things you can do for your health.  Most adults should exercise for at least 150 minutes each week. The exercise should increase your heart rate and make you sweat (moderate-intensity exercise).  Most adults should also do strengthening exercises at least twice a week. This is in addition to the moderate-intensity exercise.  Maintain a healthy weight  Body mass index (BMI) is a measurement that can be used to identify possible weight problems. It estimates body fat based on height and weight. Your health care provider can help determine your BMI and help you achieve or maintain a healthy weight.  For females 20 years of age and older:   A BMI below 18.5 is considered underweight.  A BMI of 18.5 to 24.9 is normal.  A BMI of 25 to 29.9 is considered overweight.  A BMI of 30 and above is considered obese.  Watch levels of cholesterol and blood lipids  You should start having your blood tested for lipids and cholesterol at 37 years of age, then have this test every 5 years.  You may need to have your cholesterol levels checked more often if:  Your lipid  or cholesterol levels are high.  You are older than 37 years of age.  You are at high risk for heart disease.  CANCER SCREENING   Lung Cancer  Lung cancer screening is recommended for adults 55-80 years old who are at high risk for lung cancer because of a history of smoking.  A yearly low-dose CT scan of the lungs is recommended for people who:  Currently smoke.  Have quit within the past 15 years.  Have at least a 30-pack-year history of smoking. A pack year is smoking an average of one pack of cigarettes a day for 1 year.  Yearly screening should continue until it has been 15 years since you quit.  Yearly screening should stop if you develop a health problem that would prevent you from having lung cancer treatment.  Breast Cancer  Practice breast self-awareness. This means understanding how your breasts normally appear and feel.  It also means doing regular breast self-exams. Let your health care provider know about any changes, no matter how small.  If you are in your 20s or 30s, you should have a clinical breast exam (CBE) by a health care provider every 1-3 years as part of a regular health exam.  If you are 40 or older, have a CBE every year. Also consider having a breast X-ray (mammogram) every year.  If you have a family history of breast cancer, talk to your health care provider about genetic screening.  If you   are at high risk for breast cancer, talk to your health care provider about having an MRI and a mammogram every year.  Breast cancer gene (BRCA) assessment is recommended for women who have family members with BRCA-related cancers. BRCA-related cancers include:  Breast.  Ovarian.  Tubal.  Peritoneal cancers.  Results of the assessment will determine the need for genetic counseling and BRCA1 and BRCA2 testing. Cervical Cancer Your health care provider may recommend that you be screened regularly for cancer of the pelvic organs (ovaries, uterus, and  vagina). This screening involves a pelvic examination, including checking for microscopic changes to the surface of your cervix (Pap test). You may be encouraged to have this screening done every 3 years, beginning at age 21.  For women ages 30-65, health care providers may recommend pelvic exams and Pap testing every 3 years, or they may recommend the Pap and pelvic exam, combined with testing for human papilloma virus (HPV), every 5 years. Some types of HPV increase your risk of cervical cancer. Testing for HPV may also be done on women of any age with unclear Pap test results.  Other health care providers may not recommend any screening for nonpregnant women who are considered low risk for pelvic cancer and who do not have symptoms. Ask your health care provider if a screening pelvic exam is right for you.  If you have had past treatment for cervical cancer or a condition that could lead to cancer, you need Pap tests and screening for cancer for at least 20 years after your treatment. If Pap tests have been discontinued, your risk factors (such as having a new sexual partner) need to be reassessed to determine if screening should resume. Some women have medical problems that increase the chance of getting cervical cancer. In these cases, your health care provider may recommend more frequent screening and Pap tests. Colorectal Cancer  This type of cancer can be detected and often prevented.  Routine colorectal cancer screening usually begins at 37 years of age and continues through 37 years of age.  Your health care provider may recommend screening at an earlier age if you have risk factors for colon cancer.  Your health care provider may also recommend using home test kits to check for hidden blood in the stool.  A small camera at the end of a tube can be used to examine your colon directly (sigmoidoscopy or colonoscopy). This is done to check for the earliest forms of colorectal  cancer.  Routine screening usually begins at age 50.  Direct examination of the colon should be repeated every 5-10 years through 37 years of age. However, you may need to be screened more often if early forms of precancerous polyps or small growths are found. Skin Cancer  Check your skin from head to toe regularly.  Tell your health care provider about any new moles or changes in moles, especially if there is a change in a mole's shape or color.  Also tell your health care provider if you have a mole that is larger than the size of a pencil eraser.  Always use sunscreen. Apply sunscreen liberally and repeatedly throughout the day.  Protect yourself by wearing long sleeves, pants, a wide-brimmed hat, and sunglasses whenever you are outside. HEART DISEASE, DIABETES, AND HIGH BLOOD PRESSURE   High blood pressure causes heart disease and increases the risk of stroke. High blood pressure is more likely to develop in:  People who have blood pressure in the high end   of the normal range (130-139/85-89 mm Hg).  People who are overweight or obese.  People who are African American.  If you are 38-23 years of age, have your blood pressure checked every 3-5 years. If you are 61 years of age or older, have your blood pressure checked every year. You should have your blood pressure measured twice--once when you are at a hospital or clinic, and once when you are not at a hospital or clinic. Record the average of the two measurements. To check your blood pressure when you are not at a hospital or clinic, you can use:  An automated blood pressure machine at a pharmacy.  A home blood pressure monitor.  If you are between 45 years and 39 years old, ask your health care provider if you should take aspirin to prevent strokes.  Have regular diabetes screenings. This involves taking a blood sample to check your fasting blood sugar level.  If you are at a normal weight and have a low risk for diabetes,  have this test once every three years after 37 years of age.  If you are overweight and have a high risk for diabetes, consider being tested at a younger age or more often. PREVENTING INFECTION  Hepatitis B  If you have a higher risk for hepatitis B, you should be screened for this virus. You are considered at high risk for hepatitis B if:  You were born in a country where hepatitis B is common. Ask your health care provider which countries are considered high risk.  Your parents were born in a high-risk country, and you have not been immunized against hepatitis B (hepatitis B vaccine).  You have HIV or AIDS.  You use needles to inject street drugs.  You live with someone who has hepatitis B.  You have had sex with someone who has hepatitis B.  You get hemodialysis treatment.  You take certain medicines for conditions, including cancer, organ transplantation, and autoimmune conditions. Hepatitis C  Blood testing is recommended for:  Everyone born from [birth year] through 1967.  Anyone with known risk factors for hepatitis C. Sexually transmitted infections (STIs)  You should be screened for sexually transmitted infections (STIs) including gonorrhea and chlamydia if:  You are sexually active and are younger than 37 years of age.  You are older than 37 years of age and your health care provider tells you that you are at risk for this type of infection.  Your sexual activity has changed since you were last screened and you are at an increased risk for chlamydia or gonorrhea. Ask your health care provider if you are at risk.  If you do not have HIV, but are at risk, it may be recommended that you take a prescription medicine daily to prevent HIV infection. This is called pre-exposure prophylaxis (PrEP). You are considered at risk if:  You are sexually active and do not regularly use condoms or know the HIV status of your partner(s).  You take drugs by injection.  You are sexually  active with a partner who has HIV. Talk with your health care provider about whether you are at high risk of being infected with HIV. If you choose to begin PrEP, you should first be tested for HIV. You should then be tested every 3 months for as long as you are taking PrEP.  PREGNANCY   If you are premenopausal and you may become pregnant, ask your health care provider about preconception counseling.  If you may  become pregnant, take 400 to 800 micrograms (mcg) of folic acid every day.  If you want to prevent pregnancy, talk to your health care provider about birth control (contraception). OSTEOPOROSIS AND MENOPAUSE   Osteoporosis is a disease in which the bones lose minerals and strength with aging. This can result in serious bone fractures. Your risk for osteoporosis can be identified using a bone density scan.  If you are 65 years of age or older, or if you are at risk for osteoporosis and fractures, ask your health care provider if you should be screened.  Ask your health care provider whether you should take a calcium or vitamin D supplement to lower your risk for osteoporosis.  Menopause may have certain physical symptoms and risks.  Hormone replacement therapy may reduce some of these symptoms and risks. Talk to your health care provider about whether hormone replacement therapy is right for you.  HOME CARE INSTRUCTIONS   Schedule regular health, dental, and eye exams.  Stay current with your immunizations.   Do not use any tobacco products including cigarettes, chewing tobacco, or electronic cigarettes.  If you are pregnant, do not drink alcohol.  If you are breastfeeding, limit how much and how often you drink alcohol.  Limit alcohol intake to no more than 1 drink per day for nonpregnant women. One drink equals 12 ounces of beer, 5 ounces of wine, or 1 ounces of hard liquor.  Do not use street drugs.  Do not share needles.  Ask your health care provider for help if  you need support or information about quitting drugs.  Tell your health care provider if you often feel depressed.  Tell your health care provider if you have ever been abused or do not feel safe at home.   This information is not intended to replace advice given to you by your health care provider. Make sure you discuss any questions you have with your health care provider.   Document Released: 05/16/2011 Document Revised: 11/21/2014 Document Reviewed: 10/02/2013 Elsevier Interactive Patient Education 2016 Elsevier Inc. Levonorgestrel intrauterine device (IUD) What is this medicine? LEVONORGESTREL IUD (LEE voe nor jes trel) is a contraceptive (birth control) device. The device is placed inside the uterus by a healthcare professional. It is used to prevent pregnancy and can also be used to treat heavy bleeding that occurs during your period. Depending on the device, it can be used for 3 to 5 years. This medicine may be used for other purposes; ask your health care provider or pharmacist if you have questions. What should I tell my health care provider before I take this medicine? They need to know if you have any of these conditions: -abnormal Pap smear -cancer of the breast, uterus, or cervix -diabetes -endometritis -genital or pelvic infection now or in the past -have more than one sexual partner or your partner has more than one partner -heart disease -history of an ectopic or tubal pregnancy -immune system problems -IUD in place -liver disease or tumor -problems with blood clots or take blood-thinners -use intravenous drugs -uterus of unusual shape -vaginal bleeding that has not been explained -an unusual or allergic reaction to levonorgestrel, other hormones, silicone, or polyethylene, medicines, foods, dyes, or preservatives -pregnant or trying to get pregnant -breast-feeding How should I use this medicine? This device is placed inside the uterus by a health care  professional. Talk to your pediatrician regarding the use of this medicine in children. Special care may be needed. Overdosage: If you think   you have taken too much of this medicine contact a poison control center or emergency room at once. NOTE: This medicine is only for you. Do not share this medicine with others. What if I miss a dose? This does not apply. What may interact with this medicine? Do not take this medicine with any of the following medications: -amprenavir -bosentan -fosamprenavir This medicine may also interact with the following medications: -aprepitant -barbiturate medicines for inducing sleep or treating seizures -bexarotene -griseofulvin -medicines to treat seizures like carbamazepine, ethotoin, felbamate, oxcarbazepine, phenytoin, topiramate -modafinil -pioglitazone -rifabutin -rifampin -rifapentine -some medicines to treat HIV infection like atazanavir, indinavir, lopinavir, nelfinavir, tipranavir, ritonavir -St. John's wort -warfarin This list may not describe all possible interactions. Give your health care provider a list of all the medicines, herbs, non-prescription drugs, or dietary supplements you use. Also tell them if you smoke, drink alcohol, or use illegal drugs. Some items may interact with your medicine. What should I watch for while using this medicine? Visit your doctor or health care professional for regular check ups. See your doctor if you or your partner has sexual contact with others, becomes HIV positive, or gets a sexual transmitted disease. This product does not protect you against HIV infection (AIDS) or other sexually transmitted diseases. You can check the placement of the IUD yourself by reaching up to the top of your vagina with clean fingers to feel the threads. Do not pull on the threads. It is a good habit to check placement after each menstrual period. Call your doctor right away if you feel more of the IUD than just the threads or if  you cannot feel the threads at all. The IUD may come out by itself. You may become pregnant if the device comes out. If you notice that the IUD has come out use a backup birth control method like condoms and call your health care provider. Using tampons will not change the position of the IUD and are okay to use during your period. What side effects may I notice from receiving this medicine? Side effects that you should report to your doctor or health care professional as soon as possible: -allergic reactions like skin rash, itching or hives, swelling of the face, lips, or tongue -fever, flu-like symptoms -genital sores -high blood pressure -no menstrual period for 6 weeks during use -pain, swelling, warmth in the leg -pelvic pain or tenderness -severe or sudden headache -signs of pregnancy -stomach cramping -sudden shortness of breath -trouble with balance, talking, or walking -unusual vaginal bleeding, discharge -yellowing of the eyes or skin Side effects that usually do not require medical attention (report to your doctor or health care professional if they continue or are bothersome): -acne -breast pain -change in sex drive or performance -changes in weight -cramping, dizziness, or faintness while the device is being inserted -headache -irregular menstrual bleeding within first 3 to 6 months of use -nausea This list may not describe all possible side effects. Call your doctor for medical advice about side effects. You may report side effects to FDA at 1-800-FDA-1088. Where should I keep my medicine? This does not apply. NOTE: This sheet is a summary. It may not cover all possible information. If you have questions about this medicine, talk to your doctor, pharmacist, or health care provider.    2016, Elsevier/Gold Standard. (2011-12-01 13:54:04)

## 2016-02-25 NOTE — Progress Notes (Signed)
Lisa Mayer [birth year] [id number]    History:    Presents for annual exam.  Irregular bleeding on Nexplanon placed 07/2013. New partner. History of hypothyroidism and anxiety primary care manages. [facility] . 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 States had a Mirena IUD prior to the Nexplanon and on CT scan it appeared to be misplaced/growing into the uterus  was easily removed. HSV 1. History of genital warts, had TCA applied in the past most have resolved.  Past medical history, past surgical history, family history and social history were all reviewed and documented in the EPIC chart. ENT. Had negative HIV, hepatitis and RPR at work.   ROS:  A ROS was performed and pertinent positives and negatives are included.  Exam:  Filed Vitals:   02/25/16 1108  BP: 118/80    General appearance:  Normal Thyroid:  Symmetrical, normal in size, without palpable masses or nodularity. Respiratory  Auscultation:  Clear without wheezing or rhonchi Cardiovascular  Auscultation:  Regular rate, without rubs, murmurs or gallops  Edema/varicosities:  Not grossly evident Abdominal  Soft,nontender, without masses, guarding or rebound.  Liver/spleen:  No organomegaly noted  Hernia:  None appreciated  Skin  Inspection:  Grossly normal   Breasts: Examined lying and sitting.     Right: Without masses, retractions, discharge or axillary adenopathy.     Left: Without masses, retractions, discharge or axillary adenopathy. Gentitourinary   Inguinal/mons:  Normal without inguinal adenopathy  External genitalia:  Small genital wart on left labia minora  BUS/Urethra/Skene's glands:  Normal  Vagina:  Normal  Cervix:  Normal  Uterus:  normal in size, shape and contour.  Midline and mobile Difficult to palpate size  Adnexa/parametria:     Rt: Without masses or tenderness.   Lt: Without masses or tenderness.  Anus and perineum: Normal  Digital rectal exam: Normal  sphincter tone without palpated masses or tenderness  Assessment/Plan:  35 y.o. S WF G0 for annual exam.   07/2013 Nexplanon Contraception management Hypothyroid, anxiety, GERD-primary care manages labs and meds 2012 [facility] Ob[facility]-2013 gastric bypass Genital wart  Plan: Contraception reviewed, would like to have Mirena IUD again had amenorrhea with IUD. Will schedule appointment with Dr. Audie BoxFontaine August 2017 to have Nexplanon removed and IUD placed. Reviewed slight risk for infection, perforation and hemorrhage. SBE's, exercise, calcium rich diet, continue to cut calories for continued weight loss. UA, Pap with HR HPV typing, GC/Chlamydia, declines need for HIV hepatitis or RPR. Condoms encouraged until permanent partner. Aldara 5% Monday, Wednesday, Friday place small amount on wart at  bedtime wash off in a.m.   Harrington ChallengerYOUNG,Lisa J Premier End[facility] LLCWHNP, 12:39 PM 02/25/2016

## 2016-02-26 LAB — PAP IG, CT-NG NAA, HPV HIGH-RISK
Chlamydia Probe Amp: NOT DETECTED
GC PROBE AMP: NOT DETECTED
HPV DNA High Risk: NOT DETECTED

## 2016-03-17 ENCOUNTER — Ambulatory Visit: Payer: Self-pay | Admitting: Family

## 2016-03-21 ENCOUNTER — Other Ambulatory Visit: Payer: Self-pay

## 2016-03-21 DIAGNOSIS — E039 Hypothyroidism, unspecified: Secondary | ICD-10-CM

## 2016-03-21 MED ORDER — LEVOTHYROXINE SODIUM 200 MCG PO TABS: 200.0000 ug | ORAL_TABLET | Freq: Every day | ORAL | Status: DC

## 2016-03-29 ENCOUNTER — Telehealth: Payer: Self-pay | Admitting: Family

## 2016-03-29 NOTE — Telephone Encounter (Signed)
[place] [facility]

## 2016-04-15 ENCOUNTER — Other Ambulatory Visit: Payer: Self-pay | Admitting: *Deleted

## 2016-04-15 DIAGNOSIS — K2100 Gastro-esophageal reflux disease with esophagitis, without bleeding: Secondary | ICD-10-CM

## 2016-04-15 DIAGNOSIS — F411 Generalized anxiety disorder: Secondary | ICD-10-CM

## 2016-04-15 MED ORDER — CITALOPRAM HYDROBROMIDE 20 MG PO TABS: 20.0000 mg | ORAL_TABLET | Freq: Every day | ORAL | Status: DC

## 2016-04-15 NOTE — Telephone Encounter (Signed)
Receive call pt requesting refills on her citalopram. Verified pharmacy inform will send to [facility]...Raechel Chute/lmb

## 2016-05-06 ENCOUNTER — Other Ambulatory Visit: Payer: Self-pay | Admitting: *Deleted

## 2016-05-06 DIAGNOSIS — E039 Hypothyroidism, unspecified: Secondary | ICD-10-CM

## 2016-05-06 MED ORDER — LEVOTHYROXINE SODIUM 200 MCG PO TABS: 200.0000 ug | ORAL_TABLET | Freq: Every day | ORAL | Status: DC

## 2016-05-19 ENCOUNTER — Ambulatory Visit (INDEPENDENT_AMBULATORY_CARE_PROVIDER_SITE_OTHER): Payer: No Typology Code available for payment source | Admitting: Internal Medicine

## 2016-05-19 VITALS — BP 116/76 | HR 77 | Temp 98.8°F | Resp 17 | Ht 65.0 in | Wt 241.0 lb

## 2016-05-19 DIAGNOSIS — N921 Excessive and frequent menstruation with irregular cycle: Secondary | ICD-10-CM | POA: Diagnosis not present

## 2016-05-19 DIAGNOSIS — R55 Syncope and collapse: Secondary | ICD-10-CM

## 2016-05-19 LAB — POCT URINALYSIS DIP (MANUAL ENTRY)
BILIRUBIN UA: NEGATIVE
BILIRUBIN UA: NEGATIVE
Blood, UA: NEGATIVE
Glucose, UA: NEGATIVE
LEUKOCYTES UA: NEGATIVE
NITRITE UA: NEGATIVE
PH UA: 7
PROTEIN UA: NEGATIVE
Spec Grav, UA: 1.02
Urobilinogen, UA: 0.2

## 2016-05-19 LAB — POCT CBC
Granulocyte percent: 67.3 %G (ref 37–80)
HEMATOCRIT: 33.6 % — AB (ref 37.7–47.9)
HEMOGLOBIN: 11.4 g/dL — AB (ref 12.2–16.2)
LYMPH, POC: 2.4 (ref 0.6–3.4)
MCH, POC: 27.9 pg (ref 27–31.2)
MCHC: 34 g/dL (ref 31.8–35.4)
MCV: 82.2 fL (ref 80–97)
MID (cbc): 0.7 (ref 0–0.9)
MPV: 7.2 fL (ref 0–99.8)
POC GRANULOCYTE: 6.4 (ref 2–6.9)
POC LYMPH %: 25.3 % (ref 10–50)
POC MID %: 7.4 % (ref 0–12)
Platelet Count, POC: 315 10*3/uL (ref 142–424)
RBC: 4.09 M/uL (ref 4.04–5.48)
RDW, POC: 15.6 %
WBC: 9.5 10*3/uL (ref 4.6–10.2)

## 2016-05-19 LAB — POCT URINE PREGNANCY: Preg Test, Ur: NEGATIVE

## 2016-05-19 MED ORDER — FERROUS SULFATE 325 (65 FE) MG PO TABS: 325.0000 mg | ORAL_TABLET | Freq: Every day | ORAL | Status: AC

## 2016-05-19 NOTE — Progress Notes (Signed)
Subjective:    Patient ID: Lisa Mayer, female    DOB: 1981/05/03, 35 y.o.   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 Her colleagues checked BP, BS and glucose and all wnl. Mild HA all over.Sxtoms resolved over 30 min and now just feels tired. Meal 3 hot dogs.Nothing unusual. No recent heat exhaustion, trouble sleeping, or change in meds.  Patient Active Problem List   Diagnosis Date Noted  . GERD (gastroesophageal reflux disease) 02/17/2016  . Hypothyroidism 02/17/2016  . Generalized anxiety disorder 02/17/2016  . Attention deficit hyperactivity disorder (ADHD) 02/17/2016    Current outpatient prescriptions:  .  citalopram (CELEXA) 20 MG tablet, Take 1 tablet (20 mg total) by mouth daily., Disp: 90 tablet, Rfl: 1 .  Etonogestrel (NEXPLANON [place]), Inject into the skin., Disp: , Rfl:  .  fexofenadine (ALLEGRA) 180 MG tablet, Take 180 mg by mouth daily., Disp: , Rfl:  .  levothyroxine (SYNTHROID, LEVOTHROID) 200 MCG tablet, Take 1 tablet (200 mcg total) by mouth daily before breakfast. 200 mcg 6x per week and 400 mcg 1x per week, Disp: 34 tablet, Rfl: 5 .  omeprazole (PRILOSEC) 20 MG capsule, Take 1 capsule (20 mg total) by mouth daily., Disp: 90 capsule, Rfl: 3 .  ALPRAZolam (XANAX) 1 MG tablet, Take 1 mg by mouth at bedtime as needed for anxiety. Reported on 05/19/2016, Disp: , Rfl:  .  Biotin w/ Vitamins C & E (HAIR/SKIN/NAILS PO), Take 1 tablet by mouth daily. Reported on 05/19/2016, Disp: , Rfl:  .  imiquimod (ALDARA) 5 % cream, Apply topically 3 (three) times a week. (Patient not taking: Reported on 05/19/2016), Disp: 24 each, Rfl: 1 .  LYSINE PO, Take 1 tablet by mouth daily. Reported on 05/19/2016, Disp: , Rfl:  .  Probiotic Product (PROBIOTIC PO), Take 1 tablet by mouth daily. Reported on 05/19/2016, Disp: , Rfl:  .   valACYclovir (VALTREX) 500 MG tablet, Take 1 tablet (500 mg total) by mouth 2 (two) times daily. (Patient not taking: Reported on 05/19/2016), Disp: 30 tablet, Rfl: 12  See recent labs, pcp and ob checks nexplanon but wants HCG    Review of Systems  Constitutional: Negative for fever, appetite change and unexpected weight change.  HENT: Negative for trouble swallowing.   Eyes: Negative for photophobia and pain.  Respiratory: Negative for chest tightness and shortness of breath.   Cardiovascular: Negative for chest pain, palpitations and leg swelling.  Gastrointestinal: Negative for vomiting and abdominal pain.  Genitourinary: Negative for frequency and difficulty urinating.  Psychiatric/Behavioral: Negative for sleep disturbance.       Objective:   Physical Exam  Constitutional: She is oriented to person, place, and time. She appears well-developed and well-nourished. No distress.  HENT:  Head: Normocephalic.  Right Ear: External ear normal.  Left Ear: External ear normal.  Nose: Nose normal.  Mouth/Throat: Oropharynx is clear and moist.  Eyes: Conjunctivae and EOM are normal. Pupils are equal, round, and reactive to light.  Neck: Neck supple. No thyromegaly present.  Cardiovascular: Normal rate, regular rhythm, normal heart sounds and intact distal pulses.   No murmur heard. Pulmonary/Chest: Breath sounds normal.  Musculoskeletal: She exhibits no edema.  Lymphadenopathy:    She has no cervical adenopathy.  Neurological: She is alert and oriented to person, place, and time. She has normal reflexes. No  cranial nerve deficit. She exhibits normal muscle tone. Coordination normal.  Psychiatric: She has a normal mood and affect. Her behavior is normal.  BP 116/76 mmHg  Pulse 77  Temp(Src) 98.8 F (37.1 C) (Oral)  Resp 17  Ht 5\' 5"  (1.651 m)  Wt 241 lb (109.317 kg)  BMI 40.10 kg/m2  SpO2 98%  LMP 05/16/2016  Results for orders placed or performed in visit on 05/19/16  POCT  CBC  Result Value Ref Range   WBC 9.5 4.6 - 10.2 K/uL   Lymph, poc 2.4 0.6 - 3.4   POC LYMPH PERCENT 25.3 10 - 50 %L   MID (cbc) 0.7 0 - 0.9   POC MID % 7.4 0 - 12 %M   POC Granulocyte 6.4 2 - 6.9   Granulocyte percent 67.3 37 - 80 %G   RBC 4.09 4.04 - 5.48 M/uL   Hemoglobin 11.4 (A) 12.2 - 16.2 g/dL   HCT, POC 40.933.6 (A) 81.137.7 - 47.9 %   MCV 82.2 80 - 97 fL   MCH, POC 27.9 27 - 31.2 pg   MCHC 34.0 31.8 - 35.4 g/dL   RDW, POC 91.415.6 %   Platelet Count, POC 315 142 - 424 K/uL   MPV 7.2 0 - 99.8 fL  POCT urinalysis dipstick  Result Value Ref Range   Color, UA yellow yellow   Clarity, UA clear clear   Glucose, UA negative negative   Bilirubin, UA negative negative   Ketones, POC UA negative negative   Spec Grav, UA 1.020    Blood, UA negative negative   pH, UA 7.0    Protein Ur, POC negative negative   Urobilinogen, UA 0.2    Nitrite, UA Negative Negative   Leukocytes, UA Negative Negative  POCT urine pregnancy  Result Value Ref Range   Preg Test, Ur Negative Negative       Assessment & Plan:  Vasovagal near syncope -reassure exam wnl/explained vasovagal Menorrhagia with irregular cycle Anemia secondary--start Fe  Meds ordered this encounter  Medications  . ferrous sulfate (IRON SUPPLEMENT) 325 (65 FE) MG tablet    Sig: Take 1 tablet (325 mg total) by mouth daily with breakfast. After one week increase to one twice a day with meals    Dispense:  60 tablet    Refill:  5

## 2016-05-19 NOTE — Patient Instructions (Signed)
     IF you received an x-ray today, you will receive an invoice from [facility]. Please contact [facility] at [phone] with questions or concerns regarding your invoice.   IF you received labwork today, you will receive an invoice from [facility]. Please contact [facility] at [phone] with questions or concerns regarding your invoice.   Our billing staff will not be able to assist you with questions regarding bills from these companies.  You will be contacted with the lab results as soon as they are available. The fastest way to get your results is to activate your My Chart account. Instructions are located on the last page of this paperwork. If you have not heard from us regarding the results in 2 weeks, please contact this office.      

## 2016-05-20 LAB — COMPREHENSIVE METABOLIC PANEL
ALK PHOS: 53 U/L (ref 33–115)
ALT: 12 U/L (ref 6–29)
AST: 17 U/L (ref 10–30)
Albumin: 4 g/dL (ref 3.6–5.1)
BILIRUBIN TOTAL: 0.2 mg/dL (ref 0.2–1.2)
BUN: 13 mg/dL (ref 7–25)
CALCIUM: 8.5 mg/dL — AB (ref 8.6–10.2)
CO2: 23 mmol/L (ref 20–31)
Chloride: 105 mmol/L (ref 98–110)
Creat: 0.55 mg/dL (ref 0.50–1.10)
GLUCOSE: 85 mg/dL (ref 65–99)
POTASSIUM: 4.4 mmol/L (ref 3.5–5.3)
Sodium: 139 mmol/L (ref 135–146)
TOTAL PROTEIN: 6.7 g/dL (ref 6.1–8.1)

## 2016-05-24 ENCOUNTER — Ambulatory Visit: Payer: No Typology Code available for payment source | Admitting: Family

## 2016-05-27 ENCOUNTER — Encounter: Payer: Self-pay | Admitting: Family

## 2016-05-27 ENCOUNTER — Ambulatory Visit (INDEPENDENT_AMBULATORY_CARE_PROVIDER_SITE_OTHER): Payer: No Typology Code available for payment source | Admitting: Family

## 2016-05-27 ENCOUNTER — Other Ambulatory Visit (INDEPENDENT_AMBULATORY_CARE_PROVIDER_SITE_OTHER): Payer: No Typology Code available for payment source

## 2016-05-27 VITALS — BP 120/70 | HR 88 | Temp 98.6°F | Resp 16 | Ht 65.0 in | Wt 242.0 lb

## 2016-05-27 DIAGNOSIS — D649 Anemia, unspecified: Secondary | ICD-10-CM

## 2016-05-27 DIAGNOSIS — D509 Iron deficiency anemia, unspecified: Secondary | ICD-10-CM | POA: Insufficient documentation

## 2016-05-27 DIAGNOSIS — R42 Dizziness and giddiness: Secondary | ICD-10-CM | POA: Diagnosis not present

## 2016-05-27 LAB — IBC PANEL
Iron: 24 ug/dL — ABNORMAL LOW (ref 42–145)
Saturation Ratios: 6 % — ABNORMAL LOW (ref 20.0–50.0)
TRANSFERRIN: 286 mg/dL (ref 212.0–360.0)

## 2016-05-27 MED ORDER — FERRALET 90 90-1 MG PO TABS: 1.0000 | ORAL_TABLET | Freq: Every day | ORAL | Status: DC

## 2016-05-27 MED ORDER — ALPRAZOLAM 1 MG PO TABS: 1.0000 mg | ORAL_TABLET | Freq: Every evening | ORAL | Status: AC | PRN

## 2016-05-27 NOTE — Progress Notes (Signed)
Subjective:    Patient ID: Lisa Mayer, female    DOB: 23-Oct-1981, 35 y.o.   MRN: [id number]  Chief Complaint  Patient presents with  . Follow-up    states this is a follow up from urgent care visit last week, had cold sweats and dizziness at work last week    HPI:  Lisa Mayer is a 35 y.o. female who  has a past medical history of Urinary tract infection; Genital warts; GERD (gastroesophageal reflux disease); Kidney stones; Thyroid disease; HPV (human papilloma virus) infection; and Anxiety. and presents today for a follow up office visit.   This is a new problem. Recently evaluated in urgent care for an episode of sudden blurring and darkening of vision, sweating, and nausea with the feeling of almost passing out. 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 Symptoms resolved over a 30 minutes. She had a a normal meal prior to this. Her hemoglobin was noted to be slightly low at 11.4 otherwise blood work was unremarkable. She was diagnosed with secondary anemia started on iron supplementation.All urgent care records, labs, and imaging reviewed in detail.  Since her visit to Urgent Care she reports that she is feeling better with no further episodes of dizziness or nauesa. Eating and drinking well. Reports taking the Ferrous sulfate as prescribed and notes that she has had some gastrointestinal discomfort. She continues to have heavy menstrual cycles. She does have an IUD in place and is going to be replaced in the next 2 months.  Allergies  Allergen Reactions  . Mushroom Extract Complex Swelling    Soft palate swelling  . 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   Marland Kitchen Spinach Other (See Comments)    Severe indigestion     Current Outpatient Prescriptions on File Prior to Visit  Medication Sig Dispense Refill  . Biotin w/ Vitamins C & E (HAIR/SKIN/NAILS PO) Take 1 tablet by mouth  daily. Reported on 05/19/2016    . citalopram (CELEXA) 20 MG tablet Take 1 tablet (20 mg total) by mouth daily. 90 tablet 1  . Etonogestrel (NEXPLANON [place]) Inject into the skin.    . ferrous sulfate (IRON SUPPLEMENT) 325 (65 FE) MG tablet Take 1 tablet (325 mg total) by mouth daily with breakfast. After one week increase to one twice a day with meals 60 tablet 5  . fexofenadine (ALLEGRA) 180 MG tablet Take 180 mg by mouth daily.    . imiquimod (ALDARA) 5 % cream Apply topically 3 (three) times a week. 24 each 1  . levothyroxine (SYNTHROID, LEVOTHROID) 200 MCG tablet Take 1 tablet (200 mcg total) by mouth daily before breakfast. 200 mcg 6x per week and 400 mcg 1x per week 34 tablet 5  . LYSINE PO Take 1 tablet by mouth daily. Reported on 05/19/2016    . omeprazole (PRILOSEC) 20 MG capsule Take 1 capsule (20 mg total) by mouth daily. 90 capsule 3  . Probiotic Product (PROBIOTIC PO) Take 1 tablet by mouth daily. Reported on 05/19/2016    . valACYclovir (VALTREX) 500 MG tablet Take 1 tablet (500 mg total) by mouth 2 (two) times daily. 30 tablet 12   No current facility-administered medications on file prior to visit.     Review of Systems  Constitutional: Negative for fever, chills, fatigue and unexpected weight change.  Respiratory: Positive for wheezing. Negative for chest tightness  and shortness of breath.   Cardiovascular: Negative for chest pain, palpitations and leg swelling.  Neurological: Negative for dizziness and weakness.      Objective:    BP 120/70 mmHg  Pulse 88  Temp(Src) 98.6 F (37 C) (Oral)  Resp 16  Ht 5\' 5"  (1.651 m)  Wt 242 lb (109.77 kg)  BMI 40.27 kg/m2  SpO2 97%  LMP 05/16/2016 Nursing note and vital signs reviewed.  Physical Exam  Constitutional: She is oriented to person, place, and time. She appears well-developed and well-nourished. No distress.  Cardiovascular: Normal rate, regular rhythm, normal heart sounds and intact distal pulses.   Pulmonary/Chest:  Effort normal and breath sounds normal.  Neurological: She is alert and oriented to person, place, and time.  Skin: Skin is warm and dry. There is pallor.  Psychiatric: She has a normal mood and affect. Her behavior is normal. Judgment and thought content normal.       Assessment & Plan:   Problem List Items Addressed This Visit      Other   Anemia - Primary    Noted to have anemia on recent blood work and started on iron. Does experience mild gastrointestinal distress. Obtain IBC panel to check current iron levels. Continue current dosage of ferrous sulfate pending IBC results.       Relevant Orders   IBC panel   Dizziness    Symptoms of dizziness have been resolved since her Urgent Care visit with no further episodes. Likely vasovagal as her hemoglobin is stable. No further treatment is needed at this time. Encouraged plenty of fluid intake. Follow up if symptoms return.           I have changed Ms. Coots's ALPRAZolam. I am also having her maintain her Etonogestrel (NEXPLANON [place]), omeprazole, Biotin w/ Vitamins C & E (HAIR/SKIN/NAILS PO), Probiotic Product (PROBIOTIC PO), fexofenadine, LYSINE PO, imiquimod, valACYclovir, citalopram, levothyroxine, and ferrous sulfate.   Meds ordered this encounter  Medications  . ALPRAZolam (XANAX) 1 MG tablet    Sig: Take 1 tablet (1 mg total) by mouth at bedtime as needed for anxiety. Reported on 05/19/2016    Dispense:  30 tablet    Refill:  1    Order Specific Question:  Supervising Provider    Answer:  Hillard DankerRAWFORD, ELIZABETH A [[id number]]     Follow-up: Return in about 3 months (around 08/27/2016), or if symptoms worsen or fail to improve.  Jeanine Luzalone, Gregory, FNP

## 2016-05-27 NOTE — Assessment & Plan Note (Signed)
Symptoms of dizziness have been resolved since her Urgent Care visit with no further episodes. Likely vasovagal as her hemoglobin is stable. No further treatment is needed at this time. Encouraged plenty of fluid intake. Follow up if symptoms return.

## 2016-05-27 NOTE — Assessment & Plan Note (Signed)
Noted to have anemia on recent blood work and started on iron. Does experience mild gastrointestinal distress. Obtain IBC panel to check current iron levels. Continue current dosage of ferrous sulfate pending IBC results.

## 2016-05-27 NOTE — Patient Instructions (Signed)
Thank you for choosing [facility].  Summary/Instructions:  Your prescription(s) have been submitted to your pharmacy or been printed and provided for you. Please take as directed and contact our office if you believe you are having problem(s) with the medication(s) or have any questions.  Please stop by the lab on the lower level of the building for your blood work. Your results will be released to MyChart (or called to you) after review, usually within 72 hours after test completion. If any changes need to be made, you will be notified at that same time.  1. The lab is open from 7:30am to 5:30 pm Monday-Friday  2. No appointment is necessary  3. Fasting (if needed) is 6-8 hours after food and drink; black coffee  and water are okay    If your symptoms worsen or fail to improve, please contact our office for further instruction, or in case of emergency go directly to the emergency room at the closest medical facility.   Please continue to drink plenty of water and non-caffeinated/non-alcoholic drinks.

## 2016-06-06 ENCOUNTER — Telehealth: Payer: Self-pay | Admitting: *Deleted

## 2016-06-06 NOTE — Telephone Encounter (Signed)
She is planning to have nexplanon removed and mirena IUD placed by Dr Audie Box in August, see if that is what she still wants to do, nexplanon due out in sept. Also hypothyroid has that been checked recently, not sure about the food cravings???  Explain common to have some light cycles/spotting with nexplanon

## 2016-06-06 NOTE — Telephone Encounter (Signed)
Pt called with questions about light cycle, started taking iron 3 weeks ago states cycle 1 week after taking iron and cycle was light. Pt does have Nexplanon due remove in Sept. Also mention food cravings was curious if any of this could be related to taking iron? Please advise

## 2016-06-07 NOTE — Telephone Encounter (Signed)
Pt informed by detailed VM to cell that light period can be caused by Nexplanon and it would not be affected by the iron supplements.  Iron supplements also could potentially cause dietary changes but no research to make that a definite correlation.  I did also want to make sure that her thyroid had been checked lately as that could potentially be a reason as well. Advised to call back if more questions KW CMA

## 2016-07-21 ENCOUNTER — Ambulatory Visit (INDEPENDENT_AMBULATORY_CARE_PROVIDER_SITE_OTHER): Payer: No Typology Code available for payment source | Admitting: Gynecology

## 2016-07-21 ENCOUNTER — Telehealth: Payer: Self-pay

## 2016-07-21 ENCOUNTER — Encounter: Payer: Self-pay | Admitting: *Deleted

## 2016-07-21 VITALS — BP 122/78

## 2016-07-21 DIAGNOSIS — Z3046 Encounter for surveillance of implantable subdermal contraceptive: Secondary | ICD-10-CM | POA: Diagnosis not present

## 2016-07-21 NOTE — Patient Instructions (Signed)
Follow up for IUD insertion tomorrow.

## 2016-07-21 NOTE — Telephone Encounter (Signed)
Patient advised.

## 2016-07-21 NOTE — Telephone Encounter (Signed)
Remove it tomorrow. Also reminded her no intercourse tonight as she is coming back to have her IUD placed tomorrow.

## 2016-07-21 NOTE — Progress Notes (Signed)
    Lisa HarmanJeana R Mayer June 01, 1981 [id number]        35 y.o.  G0P0000 presents to have her Nexplanon removed due to sporadic bleeding. Plans to have IUD placed tomorrow.  Past medical history,surgical history, problem list, medications, allergies, family history and social history were all reviewed and documented in the EPIC chart.  Directed ROS with pertinent positives and negatives documented in the history of present illness/assessment and plan.  Exam: Kennon PortelaKim Gardner assistant Vitals:   07/21/16 0830  BP: 122/78   General appearance:  Normal  Procedure:  The removal process was reviewed with the patient and the potential risks were discussed.  The left upper, inner arm was examined with the Nexplanon rod clearly palpated. The skin overlying the distal portion of the rod was cleansed with Betadine and infiltrated with 1% lidocaine. A small skin incision was made with a scalpel over the distal tip of the Nexplanon rod. The Nexplanon tip was subsequently delivered through the incision using blunt and sharp dissection and the tip was grasped with a forcep and the Nexplanon rod was removed intact, shown to the patient and discarded.  A Steri-Strip was used to close the small skin defect and a pressure dressing was applied with postoperative instructions given.  Assessment/Plan:  35 y.o. G0P0000 with successful removal of her Nexplanon. She is to follow up tomorrow for IUD placement. She is to abstain from intercourse until placement   Dara LordsFONTAINE,Jhalil Silvera P MD, 8:50 AM 07/21/2016

## 2016-07-21 NOTE — Progress Notes (Signed)
I called patients [employer] and spoke to Sissetoniffany.  Pt is responsible for a $40 copay for Mirena and Insertion.  KW CMA

## 2016-07-21 NOTE — Telephone Encounter (Signed)
Patient had Nexplanon removed today and wants to know how long she needs to keep the bandage on. She is going to the beach this weekend?

## 2016-07-21 NOTE — Telephone Encounter (Signed)
[facility] that will be due time of visit.

## 2016-07-22 ENCOUNTER — Ambulatory Visit (INDEPENDENT_AMBULATORY_CARE_PROVIDER_SITE_OTHER): Payer: No Typology Code available for payment source | Admitting: Gynecology

## 2016-07-22 ENCOUNTER — Encounter: Payer: Self-pay | Admitting: Gynecology

## 2016-07-22 ENCOUNTER — Telehealth: Payer: Self-pay | Admitting: *Deleted

## 2016-07-22 VITALS — BP 120/76

## 2016-07-22 DIAGNOSIS — Z30430 Encounter for insertion of intrauterine contraceptive device: Secondary | ICD-10-CM

## 2016-07-22 NOTE — Patient Instructions (Signed)
Intrauterine Device Insertion Most often, an intrauterine device (IUD) is inserted into the uterus to prevent pregnancy. There are 2 types of IUDs available:  Copper IUD--This type of IUD creates an environment that is not favorable to sperm survival. The mechanism of action of the copper IUD is not known for certain. It can stay in place for 10 years.  Hormone IUD--This type of IUD contains the hormone progestin (synthetic progesterone). The progestin thickens the cervical mucus and prevents sperm from entering the uterus, and it also thins the uterine lining. There is no evidence that the hormone IUD prevents implantation. One hormone IUD can stay in place for up to 5 years, and a different hormone IUD can stay in place for up to 3 years. An IUD is the most cost-effective birth control if left in place for the full duration. It may be removed at any time. LET YOUR HEALTH CARE PROVIDER KNOW ABOUT:  Any allergies you have.  All medicines you are taking, including vitamins, herbs, eye drops, creams, and over-the-counter medicines.  Previous problems you or members of your family have had with the use of anesthetics.  Any blood disorders you have.  Previous surgeries you have had.  Possibility of pregnancy.  Medical conditions you have. RISKS AND COMPLICATIONS  Generally, intrauterine device insertion is a safe procedure. However, as with any procedure, complications can occur. Possible complications include:  Accidental puncture (perforation) of the uterus.  Accidental placement of the IUD either in the muscle layer of the uterus (myometrium) or outside the uterus. If this happens, the IUD can be found essentially floating around the bowels and must be taken out surgically.  The IUD may fall out of the uterus (expulsion). This is more common in women who have recently had a child.   Pregnancy in the fallopian tube (ectopic).  Pelvic inflammatory disease (PID), which is infection of  the uterus and fallopian tubes. The risk of PID is slightly increased in the first 20 days after the IUD is placed, but the overall risk is still very low. BEFORE THE PROCEDURE  Schedule the IUD insertion for when you will have your menstrual period or right after, to make sure you are not pregnant. Placement of the IUD is better tolerated shortly after a menstrual cycle.  You may need to take tests or be examined to make sure you are not pregnant.  You may be required to take a pregnancy test.  You may be required to get checked for sexually transmitted infections (STIs) prior to placement. Placing an IUD in someone who has an infection can make the infection worse.  You may be given a pain reliever to take 1 or 2 hours before the procedure.  An exam will be performed to determine the size and position of your uterus.  Ask your health care provider about changing or stopping your regular medicines. PROCEDURE   A tool (speculum) is placed in the vagina. This allows your health care provider to see the lower part of the uterus (cervix).  The cervix is prepped with a medicine that lowers the risk of infection.  You may be given a medicine to numb each side of the cervix (intracervical or paracervical block). This is used to block and control any discomfort with insertion.  A tool (uterine sound) is inserted into the uterus to determine the length of the uterine cavity and the direction the uterus may be tilted.  A slim instrument (IUD inserter) is inserted through the cervical   canal and into your uterus.  The IUD is placed in the uterine cavity and the insertion device is removed.  The nylon string that is attached to the IUD and used for eventual IUD removal is trimmed. It is trimmed so that it lays high in the vagina, just outside the cervix. AFTER THE PROCEDURE  You may have bleeding after the procedure. This is normal. It varies from light spotting for a few days to menstrual-like  bleeding.  You may have mild cramping.   This information is not intended to replace advice given to you by your health care provider. Make sure you discuss any questions you have with your health care provider.   Document Released: 06/29/2011 Document Revised: 08/21/2013 Document Reviewed: 04/21/2013 Elsevier Interactive Patient Education 2016 Elsevier Inc.  

## 2016-07-22 NOTE — Telephone Encounter (Signed)
Pt left message triage regarding spotting after placement of Mirena IUD today, I called pt back and left message for her to call me

## 2016-07-22 NOTE — Progress Notes (Signed)
    Lisa HarmanJeana R Mayer Sep 22, 1981 [id number]        35 y.o.  G0P0000  presents for Mirena IUD placement. She has read through the booklet, has no contraindications and signed the consent form. She just had her Nexplanon removed yesterday.  I reviewed the insertional process with her as well as the risks to include infection, either immediate or long-term, uterine perforation or migration requiring surgery to remove, other complications such as pain, hormonal side effects, infertility and possibility of failure with subsequent pregnancy.   Exam with Kennon PortelaKim Gardner assistant Vitals:   07/22/16 0841  BP: 120/76    Pelvic: External BUS vagina normal. Cervix normal. Uterus anteverted normal size shape contour midline mobile nontender. Adnexa without masses or tenderness.  Procedure: The cervix was cleansed with Betadine, anterior lip grasped with a single-tooth tenaculum, the uterus was sounded and a Mirena IUD was placed according to manufacturer's recommendations without difficulty. The strings were trimmed. The patient tolerated well and will follow up in one month for a postinsertional check.  Lot number:  [id number]    [name] P MD, 9:01 AM 07/22/2016

## 2016-08-09 ENCOUNTER — Encounter: Payer: Self-pay | Admitting: Family

## 2016-08-09 DIAGNOSIS — F411 Generalized anxiety disorder: Secondary | ICD-10-CM

## 2016-08-09 MED ORDER — CITALOPRAM HYDROBROMIDE 20 MG PO TABS: 20.0000 mg | ORAL_TABLET | Freq: Every day | ORAL | 1 refills | Status: DC

## 2016-08-29 ENCOUNTER — Ambulatory Visit: Payer: No Typology Code available for payment source | Admitting: Family

## 2016-09-02 ENCOUNTER — Ambulatory Visit: Payer: No Typology Code available for payment source | Admitting: Family

## 2016-09-02 ENCOUNTER — Ambulatory Visit: Payer: No Typology Code available for payment source | Admitting: Gynecology

## 2016-09-09 ENCOUNTER — Ambulatory Visit (INDEPENDENT_AMBULATORY_CARE_PROVIDER_SITE_OTHER): Payer: No Typology Code available for payment source | Admitting: Gynecology

## 2016-09-09 ENCOUNTER — Other Ambulatory Visit (INDEPENDENT_AMBULATORY_CARE_PROVIDER_SITE_OTHER): Payer: No Typology Code available for payment source

## 2016-09-09 ENCOUNTER — Ambulatory Visit (INDEPENDENT_AMBULATORY_CARE_PROVIDER_SITE_OTHER): Payer: No Typology Code available for payment source | Admitting: Family

## 2016-09-09 ENCOUNTER — Encounter: Payer: Self-pay | Admitting: Gynecology

## 2016-09-09 ENCOUNTER — Encounter: Payer: Self-pay | Admitting: Family

## 2016-09-09 VITALS — BP 130/78

## 2016-09-09 VITALS — BP 98/62 | HR 85 | Temp 98.3°F | Resp 16 | Ht 65.0 in | Wt 244.0 lb

## 2016-09-09 DIAGNOSIS — D500 Iron deficiency anemia secondary to blood loss (chronic): Secondary | ICD-10-CM

## 2016-09-09 DIAGNOSIS — M25561 Pain in right knee: Secondary | ICD-10-CM

## 2016-09-09 DIAGNOSIS — Z30431 Encounter for routine checking of intrauterine contraceptive device: Secondary | ICD-10-CM

## 2016-09-09 LAB — IBC PANEL
Iron: 70 ug/dL (ref 42–145)
SATURATION RATIOS: 17 % — AB (ref 20.0–50.0)
TRANSFERRIN: 294 mg/dL (ref 212.0–360.0)

## 2016-09-09 LAB — CBC
HEMATOCRIT: 41.5 % (ref 36.0–46.0)
HEMOGLOBIN: 14.3 g/dL (ref 12.0–15.0)
MCHC: 34.3 g/dL (ref 30.0–36.0)
MCV: 88.4 fl (ref 78.0–100.0)
PLATELETS: 302 10*3/uL (ref 150.0–400.0)
RBC: 4.7 Mil/uL (ref 3.87–5.11)
RDW: 15.2 % (ref 11.5–15.5)
WBC: 10.7 10*3/uL — ABNORMAL HIGH (ref 4.0–10.5)

## 2016-09-09 NOTE — Assessment & Plan Note (Signed)
Right knee pain consistent with hyperextension versus possible meniscal pathology given mechanism. Treat conservatively with continued ice, home exercise therapy and over-the-counter medications as needed for symptom relief and supportive care. Continue previous brace as needed. If symptoms worsen or do not improve consider ultrasound and/or possible cortisone injection.

## 2016-09-09 NOTE — Patient Instructions (Addendum)
Thank you for choosing [facility].  SUMMARY AND INSTRUCTIONS:  Ice x 20 minutes and after activity  Advil / Aleve as need  Exercises daily.  Brace as needed.  Medication:  Please continue to take the ferrous sulfate for the next 1-2 months.   Your prescription(s) have been submitted to your pharmacy or been printed and provided for you. Please take as directed and contact our office if you believe you are having problem(s) with the medication(s) or have any questions.  Labs:  Please stop by the lab on the lower level of the building for your blood work. Your results will be released to [place] (or called to you) after review, usually within 72 hours after test completion. If any changes need to be made, you will be notified at that same time.  1.) The lab is open from 7:30am to 5:30 pm Monday-Friday 2.) No appointment is necessary 3.) Fasting (if needed) is 6-8 hours after food and drink; black coffee and water are okay   Follow up:  If your symptoms worsen or fail to improve, please contact our office for further instruction, or in case of emergency go directly to the emergency room at the closest medical facility.    Meniscus Tear With Phase II Rehab The meniscus is a C-shaped cartilage structure, located in the knee joint between the thigh bone (femur) and the shinbone (tibia). Two menisci are located in each knee joint: the inner and outer meniscus. The meniscus acts as an adapter between the thigh bone and shinbone, allowing them to fit properly together. It also functions as a shock absorber, to reduce the stress placed on the knee joint and to help supply nutrients to the knee joint cartilage. As people age, the meniscus begins to harden and become more vulnerable to injury. Meniscus tears are a common injury, especially in older athletes. Inner meniscus tears are more common than outer meniscus tears.  SYMPTOMS   Pain in the knee, especially with standing or  squatting with the affected leg.  Tenderness along the joint line.  Swelling in the knee joint (effusion), usually starting 1 to 2 days after injury.  Locking or catching of the knee joint, causing inability to straighten the knee completely.  Giving way or buckling of the knee. CAUSES  A meniscus tear occurs when a force is placed on the meniscus that is greater than it can handle. Common causes of injury include:  Direct hit (trauma) to the knee.  Twisting, pivoting, or cutting (rapidly changing direction while running), kneeling or squatting.  Without injury, due to aging. RISK INCREASES WITH:  Contact sports (football, rugby).  Sports in which cleats are used with pivoting (soccer, lacrosse) or sports in which good shoe grip and sudden change in direction are required (racquetball, basketball, squash).  Previous knee injury.  Associated knee injury, particularly ligament injuries.  Poor strength and flexibility. PREVENTION  Warm up and stretch properly before activity.  Maintain physical fitness:  Strength, flexibility, and endurance.  Cardiovascular fitness.  Protect the knee with a brace or elastic bandage.  Wear properly fitted protective equipment (proper cleats for the surface). PROGNOSIS  Sometimes, meniscus tears heal on their own. However, definitive treatment requires surgery, followed by at least 6 weeks of recovery.  RELATED COMPLICATIONS   Recurring symptoms that result in a chronic problem.  Repeated knee injury, especially if sports are resumed too soon after injury or surgery.  Progression of the tear (the tear gets larger), if untreated.  Arthritis of the knee in later years (with or without surgery).  Complications of surgery, including infection, bleeding, injury to nerves (numbness, weakness, paralysis) continued pain, giving way, locking, nonhealing of meniscus (if repaired), need for further surgery, and knee stiffness (loss of  motion). TREATMENT  Treatment first involves the use of ice and medicine, to reduce pain and inflammation. You may find using crutches to walk more comfortable. However, it is okay to bear weight on the injured knee, if the pain will allow it. Surgery is often advised as a definitive treatment. Surgery is performed through an incision near the joint (arthroscopically). The torn piece of the meniscus is removed, and if possible the joint cartilage is repaired. After surgery, the joint must be restrained. After restraint, it is important to perform strengthening and stretching exercises to help regain strength and a full range of motion. These exercises may be completed at home or with a therapist.  MEDICATION   If pain medicine is needed, nonsteroidal anti-inflammatory medicines (aspirin and ibuprofen), or other minor pain relievers (acetaminophen), are often advised.  Do not take pain medicine for 7 days before surgery.  Prescription pain relievers may be given, if your caregiver thinks they are needed. Use only as directed and only as much as you need. HEAT AND COLD:  Cold treatment (icing) should be applied for 10 to 15 minutes every 2 to 3 hours for inflammation and pain, and immediately after activity that aggravates your symptoms. Use ice packs or an ice massage.  Heat treatment may be used before performing stretching and strengthening activities prescribed by your caregiver, physical therapist, or athletic trainer. Use a heat pack or a warm water soak. SEEK MEDICAL CARE IF:  Symptoms get worse or do not improve in 2 weeks, despite treatment.  New, unexplained symptoms develop. (Drugs used in treatment may produce side effects.) EXERCISES RANGE OF MOTION (ROM) AND STRETCHING EXERCISES - Meniscus Tear, Non-operative Phase II After your physician, physical therapist or athletic trainer feels your knee has made progress significant enough to begin more advanced exercises, he or she may  recommend some of the exercises that follow. He or she may also advise you to continue with the exercises which you completed in Phase I of your rehabilitation. While completing these exercises, remember:   Restoring tissue flexibility helps normal motion to return to the joints. This allows healthier, less painful movement and activity.  An effective stretch should be held for at least 30 seconds.  A stretch should never be painful. You should only feel a gentle lengthening or release in the stretched tissue. STRETCH - Quadriceps, Prone   Lie on your stomach on a firm surface, such as a bed or padded floor.  Bend your right / left knee and grasp your ankle. If you are unable to reach your ankle or pant leg, use a belt around your foot to lengthen your reach.  Gently pull your heel toward your buttocks. Your knee should not slide out to the side. You should feel a stretch in the front of your thigh and knee.  Hold this position for __________ seconds. Repeat __________ times. Complete this stretch __________ times per day.  STRETCH - Knee Extension, Prone  Lie on your stomach on a firm surface, such as a bed or countertop. Place your right / left knee and leg just beyond the edge of the surface. You may wish to place a towel under the far end of your right / left thigh for comfort.  Relax your leg muscles and allow gravity to straighten your knee. Your caregiver may advise you to add an ankle weight, if more resistance is helpful for you.  You should feel a stretch in the back of your right / left knee. Hold this position for __________ seconds. Repeat __________ times. Complete this __________ times per day. STRENGTHENING EXERCISES - Meniscus Tear Phase II These are some of the exercises you may progress to in your rehabilitation program. It is critical that you follow the instructions of your caregiver. Based on your individual needs, your caregiver may choose a more or less aggressive  approach than the exercises presented. Remember:   Strong muscles with good endurance tolerate stress better.  Do the exercises as initially prescribed by your caregiver. Progress slowly with each exercise, gradually increasing the number of repetitions and weight used under his or her guidance. STRENGTH - Quadriceps, Short Arcs   Lie on your back. Place a __________ inch towel roll under your right / left knee, so that the knee bends slightly.  Raise only your lower leg by tightening the muscles in the front of your thigh. Do not allow your thigh to rise.  Hold this position for __________ seconds. Repeat __________ times. Complete this exercise __________ times per day.  OPTIONAL ANKLE WEIGHTS: Begin with ____________________, but DO NOT exceed ____________________. Increase in 1 pound/0.5 kilogram increments. STRENGTH - Quadriceps, Step-Ups   Use a thick book, step or step stool that is __________ inches tall.  Hold a wall or counter for balance only, not support.  Slowly step up with your right / left foot, keeping your knee in line with your hip and foot. Do not allow your knee to bend so far that you cannot see your toes.  Slowly unlock your knee and lower yourself to the starting position. Your muscles, not gravity, should lower you. Repeat __________ times. Complete this exercise __________ times per day.  STRENGTH - Quadriceps, Wall Slides  Follow guidelines for form closely. Increased knee pain often results from poorly placed feet or knees.  Lean against a smooth wall or door and walk your feet out 18-24 inches. Place your feet hip width apart.  Slowly slide down the wall or door until your knees bend __________ degrees.* Keep your knees over your heels, not your toes, and in line with your hips, not falling to either side.  Hold for __________ seconds. Stand up to rest for __________ seconds in between each repetition. Repeat __________ times. Complete this exercise  __________ times per day. * Your physician, physical therapist or athletic trainer will alter this angle based on your symptoms and progress. STRENGTH - Hamstring, Curls  Lay on your stomach with your legs extended. (If you lay on a bed, your feet may hang over the edge.)  Tighten the muscles in the back of your thigh to bend your right / left knee up to 90 degrees. Keep your hips flat on the bed.  Hold this position for __________ seconds.  Slowly lower your leg back to the starting position. Repeat __________ times. Complete this exercise __________ times per day.  OPTIONAL ANKLE WEIGHTS: Begin with ____________________, but DO NOT exceed ____________________. Increase in 1 pound/0.5 kilogram increments.   This information is not intended to replace advice given to you by your health care provider. Make sure you discuss any questions you have with your health care provider.   Document Released: 02/21/2006 Document Revised: 03/17/2015 Document Reviewed: 02/12/2009 Elsevier Interactive Patient Education 2016  Elsevier Inc[employer]  Iron-Rich Diet Iron is a mineral that helps your body to produce hemoglobin. Hemoglobin is a protein in your red blood cells that carries oxygen to your body's tissues. Eating too little iron may cause you to feel weak and tired, and it can increase your risk for infection. Eating enough iron is necessary for your body's metabolism, muscle function, and nervous system. Iron is naturally found in many foods. It can also be added to foods or fortified in foods. There are two types of dietary iron:  Heme iron. Heme iron is absorbed by the body more easily than nonheme iron. Heme iron is found in meat, poultry, and fish.  Nonheme iron. Nonheme iron is found in dietary supplements, iron-fortified grains, beans, and vegetables. You may need to follow an iron-rich diet if:  You have been diagnosed with iron deficiency or iron-deficiency anemia.  You have a condition that  prevents you from absorbing dietary iron, such as:  Infection in your intestines.  Celiac disease. This involves long-lasting (chronic) inflammation of your intestines.  You do not eat enough iron.  You eat a diet that is high in foods that impair iron absorption.  You have lost a lot of blood.  You have heavy bleeding during your menstrual cycle.  You are pregnant. WHAT IS MY PLAN? Your health care provider may help you to determine how much iron you need per day based on your condition. Generally, when a person consumes sufficient amounts of iron in the diet, the following iron needs are met:  Men.  41-73 years old: 11 mg per day.  56-51 years old: 8 mg per day.  Women.   67-48 years old: 15 mg per day.  4-36 years old: 18 mg per day.  Over 69 years old: 8 mg per day.  Pregnant women: 27 mg per day.  Breastfeeding women: 9 mg per day. WHAT DO I NEED TO KNOW ABOUT AN IRON-RICH DIET?  Eat fresh fruits and vegetables that are high in vitamin C along with foods that are high in iron. This will help increase the amount of iron that your body absorbs from food, especially with foods containing nonheme iron. Foods that are high in vitamin C include oranges, peppers, tomatoes, and mango.  Take iron supplements only as directed by your health care provider. Overdose of iron can be life-threatening. If you were prescribed iron supplements, take them with orange juice or a vitamin C supplement.  Cook foods in pots and pans that are made from iron.   Eat nonheme iron-containing foods alongside foods that are high in heme iron. This helps to improve your iron absorption.   Certain foods and drinks contain compounds that impair iron absorption. Avoid eating these foods in the same meal as iron-rich foods or with iron supplements. These include:  Coffee, black tea, and red wine.  Milk, dairy products, and foods that are high in calcium.  Beans, soybeans, and peas.  Whole  grains.  When eating foods that contain both nonheme iron and compounds that impair iron absorption, follow these tips to absorb iron better.   Soak beans overnight before cooking.  Soak whole grains overnight and drain them before using.  Ferment flours before baking, such as using yeast in bread dough. WHAT FOODS CAN I EAT? Grains Iron-fortified breakfast cereal. Iron-fortified whole-wheat bread. Enriched rice. Sprouted grains. Vegetables Spinach. Potatoes with skin. Green peas. Broccoli. Red and green bell peppers. Fermented vegetables. Fruits Prunes. Raisins. Oranges. Strawberries. Mango. Grapefruit.  Meats and Other Protein Sources Beef liver. Oysters. Beef. Shrimp. [place]. Chicken. [place]. Sardines. Chickpeas. Nuts. Tofu. Beverages Tomato juice. Fresh orange juice. Prune juice. Hibiscus tea. Fortified instant breakfast shakes. Condiments Tahini. Fermented soy sauce. Sweets and Desserts Black-strap molasses.  Other Wheat germ. The items listed above may not be a complete list of recommended foods or beverages. Contact your dietitian for more options. WHAT FOODS ARE NOT RECOMMENDED? Grains Whole grains. Bran cereal. Bran flour. Oats. Vegetables Artichokes. Brussels sprouts. Kale. Fruits Blueberries. Raspberries. Strawberries. Figs. Meats and Other Protein Sources Soybeans. Products made from soy protein. Dairy Milk. Cream. Cheese. Yogurt. Cottage cheese. Beverages Coffee. Black tea. Red wine. Sweets and Desserts Cocoa. Chocolate. Ice cream. Other Basil. Oregano. Parsley. The items listed above may not be a complete list of foods and beverages to avoid. Contact your dietitian for more information.   This information is not intended to replace advice given to you by your health care provider. Make sure you discuss any questions you have with your health care provider.   Document Released: 06/14/2005 Document Revised: 11/21/2014 Document Reviewed:  05/28/2014 Elsevier Interactive Patient Education [employer].

## 2016-09-09 NOTE — Assessment & Plan Note (Signed)
Self discontinued ferrous sulfate approximate 3 weeks ago experiencing some increased fatigue. Menstrual cycles have been under control for the last one month with insertion of an intrauterine device. Obtain ferritin, IBC panel, and CBC. Continue ferrous sulfate 1-2 months and recheck for stability.

## 2016-09-09 NOTE — Progress Notes (Signed)
Subjective:    Patient ID: Lisa Mayer, female    DOB: 19-Jul-1981, 35 y.o.   MRN: [id number]  Chief Complaint  Patient presents with  . Follow-up    iron check    HPI:  Lisa Mayer is a 35 y.o. female who  has a past medical history of Anxiety; Genital warts; GERD (gastroesophageal reflux disease); HPV (human papilloma virus) infection; Kidney stones; Thyroid disease; and Urinary tract infection. and presents today for a follow up office visit.    1.) Iron deficiency anemia - Currently maintained on ferrous sulfate and stopped taking the medication about 3 weeks ago. She has started to experience fatigue about 1 week after she stopped taking the iron. Denies any shortness of breath. No chest pain, shortness of breath, or heart palpitations. She has stopped having heavy menstrual cycles about 1 month ago with the insertion of a Mirena.  2.) Right knee pain - This is a new problem. Asssociated symptom of pain located in her right knee has been going on for 3 weeks following hyperextending it. Modifying factors include a brace which does help with symptoms but expresses a popping sensation located primarily behind the knee. No previous history of injury to either knee.    Allergies  Allergen Reactions  . Mushroom Extract Complex Swelling    Soft palate swelling  . 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   Marland Kitchen. Spinach Other (See Comments)    Severe indigestion      Outpatient Medications Prior to Visit  Medication Sig Dispense Refill  . ALPRAZolam (XANAX) 1 MG tablet Take 1 tablet (1 mg total) by mouth at bedtime as needed for anxiety. Reported on 05/19/2016 30 tablet 1  . Biotin w/ Vitamins C & E (HAIR/SKIN/NAILS PO) Take 1 tablet by mouth daily. Reported on 05/19/2016    . citalopram (CELEXA) 20 MG tablet Take 1 tablet (20 mg total) by mouth daily. 90 tablet 1  . ferrous sulfate (IRON SUPPLEMENT) 325 (65 FE) MG tablet Take 1 tablet (325  mg total) by mouth daily with breakfast. After one week increase to one twice a day with meals 60 tablet 5  . fexofenadine (ALLEGRA) 180 MG tablet Take 180 mg by mouth daily.    . imiquimod (ALDARA) 5 % cream Apply topically 3 (three) times a week. 24 each 1  . levothyroxine (SYNTHROID, LEVOTHROID) 200 MCG tablet Take 1 tablet (200 mcg total) by mouth daily before breakfast. 200 mcg 6x per week and 400 mcg 1x per week 34 tablet 5  . LYSINE PO Take 1 tablet by mouth daily. Reported on 05/19/2016    . omeprazole (PRILOSEC) 20 MG capsule Take 1 capsule (20 mg total) by mouth daily. 90 capsule 3  . Probiotic Product (PROBIOTIC PO) Take 1 tablet by mouth daily. Reported on 05/19/2016    . valACYclovir (VALTREX) 500 MG tablet Take 1 tablet (500 mg total) by mouth 2 (two) times daily. 30 tablet 12   No facility-administered medications prior to visit.       Past Surgical History:  Procedure Laterality Date  . CHOLECYSTECTOMY    . gastric bypass    . INTRAUTERINE DEVICE INSERTION  07/23/2016   Mirena  . LEEP    . TONSILECTOMY, ADENOIDECTOMY, BILATERAL MYRINGOTOMY AND TUBES        Past Medical History:  Diagnosis Date  . Anxiety   . Genital warts   . GERD (gastroesophageal  reflux disease)   . HPV (human papilloma virus) infection   . Kidney stones   . Thyroid disease   . Urinary tract infection       Review of Systems  Constitutional: Positive for fatigue. Negative for chills.  Respiratory: Negative for chest tightness and shortness of breath.   Cardiovascular: Negative for chest pain, palpitations and leg swelling.  Musculoskeletal:       Positive for right knee pain  Neurological: Negative for weakness and numbness.      Objective:    BP 98/62 (BP Location: Left Arm, Patient Position: Sitting, Cuff Size: Large)   Pulse 85   Temp 98.3 F (36.8 C) (Oral)   Resp 16   Ht 5\' 5"  (1.651 m)   Wt 244 lb (110.7 kg)   SpO2 98%   BMI 40.60 kg/m  Nursing note and vital signs  reviewed.  Physical Exam  Constitutional: She is oriented to person, place, and time. She appears well-developed and well-nourished. No distress.  Cardiovascular: Normal rate, regular rhythm, normal heart sounds and intact distal pulses.   Pulmonary/Chest: Effort normal and breath sounds normal.  Musculoskeletal:  Right knee - no obvious deformity, discoloration, or edema. Palpable tenderness of medial joint line and posterior knee with no crepitus or deformity noted. Range of motion within normal limits with some discomfort in full extension. Distal pulses and sensation are intact and appropriate. Negative ligamentous testing. Positive meniscal testing.  Neurological: She is alert and oriented to person, place, and time.  Skin: Skin is warm and dry. No pallor.  Psychiatric: She has a normal mood and affect. Her behavior is normal. Judgment and thought content normal.       Assessment & Plan:   Problem List Items Addressed This Visit      Other   Anemia, iron deficiency - Primary    Self discontinued ferrous sulfate approximate 3 weeks ago experiencing some increased fatigue. Menstrual cycles have been under control for the last one month with insertion of an intrauterine device. Obtain ferritin, IBC panel, and CBC. Continue ferrous sulfate 1-2 months and recheck for stability.      Relevant Orders   IBC panel   CBC   Ferritin   Acute pain of right knee    Right knee pain consistent with hyperextension versus possible meniscal pathology given mechanism. Treat conservatively with continued ice, home exercise therapy and over-the-counter medications as needed for symptom relief and supportive care. Continue previous brace as needed. If symptoms worsen or do not improve consider ultrasound and/or possible cortisone injection.       Other Visit Diagnoses   None.      I am having [name] maintain her omeprazole, Biotin w/ Vitamins C & E (HAIR/SKIN/NAILS PO), Probiotic Product  (PROBIOTIC PO), fexofenadine, LYSINE PO, imiquimod, valACYclovir, levothyroxine, ferrous sulfate, ALPRAZolam, and citalopram.   Follow-up: Return in about 2 months (around 11/09/2016), or if symptoms worsen or fail to improve.  Jeanine Luz, FNP

## 2016-09-09 NOTE — Progress Notes (Signed)
    Lisa HarmanJeana R Mayer 1981/07/03 [id number]        35 y.o.  G0P0000 presents for follow up IUD check. Had Mirena IUD placed 07/22/2016. Has done well since with a lighter first menses.  Past medical history,surgical history, problem list, medications, allergies, family history and social history were all reviewed and documented in the EPIC chart.  Directed ROS with pertinent positives and negatives documented in the history of present illness/assessment and plan.  Exam: Kennon PortelaKim Gardner assistant Vitals:   09/09/16 1450  BP: 130/78   General appearance:  Normal Abdomen soft nontender without masses guarding rebound Pelvic external BUS vagina normal. Cervix normal with IUD string visualized an appropriate length. Uterus normal size midline mobile nontender. Adnexa without masses or tenderness.  Assessment/Plan:  35 y.o. G0P0000 with normal IUD check. Doing well. Follow up in April 2018 when due for annual exam, sooner if any issues.    Dara LordsFONTAINE,Kimani Bedoya P MD, 3:07 PM 09/09/2016

## 2016-09-09 NOTE — Patient Instructions (Signed)
Follow up April 2018 when you're due for your annual exam.

## 2016-09-12 ENCOUNTER — Encounter: Payer: Self-pay | Admitting: Family

## 2016-09-12 LAB — FERRITIN: Ferritin: 6.6 ng/mL — ABNORMAL LOW (ref 10.0–291.0)

## 2016-09-20 ENCOUNTER — Ambulatory Visit (INDEPENDENT_AMBULATORY_CARE_PROVIDER_SITE_OTHER): Payer: No Typology Code available for payment source | Admitting: Family

## 2016-09-20 ENCOUNTER — Encounter: Payer: Self-pay | Admitting: Family

## 2016-09-20 VITALS — BP 120/80 | HR 88 | Temp 98.3°F | Resp 16 | Ht 65.0 in | Wt 224.0 lb

## 2016-09-20 DIAGNOSIS — R221 Localized swelling, mass and lump, neck: Secondary | ICD-10-CM | POA: Diagnosis not present

## 2016-09-20 MED ORDER — CEFUROXIME AXETIL 250 MG PO TABS: 250.0000 mg | ORAL_TABLET | Freq: Two times a day (BID) | ORAL | 0 refills | Status: AC

## 2016-09-20 MED ORDER — MAGIC MOUTHWASH: ORAL | 0 refills | Status: AC

## 2016-09-20 NOTE — Assessment & Plan Note (Signed)
Sore throat and lymphadenopathy most likely from recent dental work although there is some concern for infection. No respiratory distress or stridor. Start Ceftin and magic mouth wash. Follow up with dentistry as scheduled. Continue to monitor.

## 2016-09-20 NOTE — Patient Instructions (Signed)
Thank you for choosing [facility].  SUMMARY AND INSTRUCTIONS:  Medication:  Your prescription(s) have been submitted to your pharmacy or been printed and provided for you. Please take as directed and contact our office if you believe you are having problem(s) with the medication(s) or have any questions.  Labs:  Please stop by the lab on the lower level of the building for your blood work. Your results will be released to MyChart (or called to you) after review, usually within 72 hours after test completion. If any changes need to be made, you will be notified at that same time.  1.) The lab is open from 7:30am to 5:30 pm Monday-Friday 2.) No appointment is necessary 3.) Fasting (if needed) is 6-8 hours after food and drink; black coffee and water are okay   Follow up:  If your symptoms worsen or fail to improve, please contact our office for further instruction, or in case of emergency go directly to the emergency room at the closest medical facility.

## 2016-09-20 NOTE — Progress Notes (Signed)
Subjective:    Patient ID: Lisa Mayer, female    DOB: Nov 29, 1980, 35 y.o.   MRN: [id number]  Chief Complaint  Patient presents with  . Sore Throat    neck feels really swollen, hurts to eat and swallow, started over the weekend, does have some drainage, had mouth surgery thursday    HPI:  Lisa Mayer is a 35 y.o. female who  has a past medical history of Anxiety; Genital warts; GERD (gastroesophageal reflux disease); HPV (human papilloma virus) infection; Kidney stones; Thyroid disease; and Urinary tract infection. and presents today for an acute office visit.  This is a new problem. Associated symptom of neck swelling has been going on for about 3-4 days. Had recent mouth surgery about 5 days ago. Describes painful swallowing and eating and feels like there is obstruction. She had a extraction and flap for a tooth. Occasional low grade fever.  Modifying factors include Mucinex, Nyquil, and motrin which have not helped very much. No recent antibiotics.   Allergies  Allergen Reactions  . Mushroom Extract Complex Swelling    Soft palate swelling  . 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   Marland Kitchen. Spinach Other (See Comments)    Severe indigestion      Outpatient Medications Prior to Visit  Medication Sig Dispense Refill  . ALPRAZolam (XANAX) 1 MG tablet Take 1 tablet (1 mg total) by mouth at bedtime as needed for anxiety. Reported on 05/19/2016 30 tablet 1  . Biotin w/ Vitamins C & E (HAIR/SKIN/NAILS PO) Take 1 tablet by mouth daily. Reported on 05/19/2016    . citalopram (CELEXA) 20 MG tablet Take 1 tablet (20 mg total) by mouth daily. 90 tablet 1  . ferrous sulfate (IRON SUPPLEMENT) 325 (65 FE) MG tablet Take 1 tablet (325 mg total) by mouth daily with breakfast. After one week increase to one twice a day with meals 60 tablet 5  . fexofenadine (ALLEGRA) 180 MG tablet Take 180 mg by mouth daily.    . imiquimod (ALDARA) 5 % cream Apply  topically 3 (three) times a week. 24 each 1  . levothyroxine (SYNTHROID, LEVOTHROID) 200 MCG tablet Take 1 tablet (200 mcg total) by mouth daily before breakfast. 200 mcg 6x per week and 400 mcg 1x per week 34 tablet 5  . LYSINE PO Take 1 tablet by mouth daily. Reported on 05/19/2016    . omeprazole (PRILOSEC) 20 MG capsule Take 1 capsule (20 mg total) by mouth daily. 90 capsule 3  . Probiotic Product (PROBIOTIC PO) Take 1 tablet by mouth daily. Reported on 05/19/2016    . valACYclovir (VALTREX) 500 MG tablet Take 1 tablet (500 mg total) by mouth 2 (two) times daily. 30 tablet 12   No facility-administered medications prior to visit.       Past Surgical History:  Procedure Laterality Date  . CHOLECYSTECTOMY    . gastric bypass    . INTRAUTERINE DEVICE INSERTION  07/23/2016   Mirena  . LEEP    . TONSILECTOMY, ADENOIDECTOMY, BILATERAL MYRINGOTOMY AND TUBES        Past Medical History:  Diagnosis Date  . Anxiety   . Genital warts   . GERD (gastroesophageal reflux disease)   . HPV (human papilloma virus) infection   . Kidney stones   . Thyroid disease   . Urinary tract infection       Review of Systems  Constitutional: Positive for fever.  Negative for chills.  HENT: Positive for sore throat. Negative for congestion.   Respiratory: Negative for cough, chest tightness, shortness of breath and wheezing.   Cardiovascular: Negative for chest pain, palpitations and leg swelling.      Objective:    BP 120/80 (BP Location: Left Arm, Patient Position: Sitting, Cuff Size: Large)   Pulse 88   Temp 98.3 F (36.8 C) (Oral)   Resp 16   Ht 5\' 5"  (1.651 m)   Wt 224 lb (101.6 kg)   LMP 07/28/2016   SpO2 97%   BMI 37.28 kg/m  Nursing note and vital signs reviewed.  Physical Exam  Constitutional: She is oriented to person, place, and time. She appears well-developed and well-nourished. No distress.  HENT:  Lower right dental work noted with stiches in place and mild inflammation with  no significant evidence of infection.   Neck: Neck supple. No thyromegaly present.  Cardiovascular: Normal rate, regular rhythm, normal heart sounds and intact distal pulses.   Pulmonary/Chest: Effort normal and breath sounds normal. No respiratory distress. She has no wheezes. She has no rales. She exhibits no tenderness.  Lymphadenopathy:    She has cervical adenopathy.  Neurological: She is alert and oriented to person, place, and time.  Skin: Skin is warm and dry.  Psychiatric: She has a normal mood and affect. Her behavior is normal. Judgment and thought content normal.       Assessment & Plan:   Problem List Items Addressed This Visit      Other   Swollen throat - Primary    Sore throat and lymphadenopathy most likely from recent dental work although there is some concern for infection. No respiratory distress or stridor. Start Ceftin and magic mouth wash. Follow up with dentistry as scheduled. Continue to monitor.           I am having Ms. [place] start on magic mouthwash and cefUROXime. I am also having her maintain her omeprazole, Biotin w/ Vitamins C & E (HAIR/SKIN/NAILS PO), Probiotic Product (PROBIOTIC PO), fexofenadine, LYSINE PO, imiquimod, valACYclovir, levothyroxine, ferrous sulfate, ALPRAZolam, and citalopram.   Meds ordered this encounter  Medications  . magic mouthwash SOLN    Sig: Swish, gargle, and spit one to two teaspoonfuls every six hours as needed. May be swallowed.    Dispense:  120 mL    Refill:  0    1 Part viscous lidocaine 2% 1 Part Maalox (do not substitute Kaopectate) 1 Part diphenhydramine 12.5 mg per 5 ml elixir    Order Specific Question:   Supervising Provider    Answer:   Hillard DankerRAWFORD, ELIZABETH A [[id number]]  . cefUROXime (CEFTIN) 250 MG tablet    Sig: Take 1 tablet (250 mg total) by mouth 2 (two) times daily with a meal.    Dispense:  14 tablet    Refill:  0    Order Specific Question:   Supervising Provider    Answer:   Hillard DankerRAWFORD, ELIZABETH A  [[id number]]     Follow-up: Return if symptoms worsen or fail to improve.  Jeanine Luzalone, Gregory, FNP

## 2016-12-21 ENCOUNTER — Telehealth: Payer: Self-pay | Admitting: Family

## 2016-12-21 NOTE — Telephone Encounter (Signed)
PLEASE NOTE: All timestamps contained within this report are represented as [place] Standard Time. CONFIDENTIALTY NOTICE: This fax transmission is intended only for the addressee. It contains information that is legally privileged, confidential or otherwise protected from use or disclosure. If you are not the intended recipient, you are strictly prohibited from reviewing, disclosing, copying using or disseminating any of this information or taking any action in reliance on or regarding this information. If you have received this fax in error, please notify us immediately by telephone so that we can arrange for its return to us. Phone: [phone], Toll-Free: [phone], Fax: [phone] Page: 1 of 1 Call Id: [id number] [facility] - Client TELEPHONE ADVICE RECORD [facility] Patient Name: Lisa Mayer DOB: 09/19/1981 Initial Comment Caller states can't breathe out of left side, meds not helping, bad cough, having some dizziness Nurse Assessment Nurse: Reed Pandyamsey, RN, Amy Date/Time (Eastern Time): 12/21/2016 9:13:51 AM Confirm and document reason for call. If symptomatic, describe symptoms. ---Caller states she already has an appointment for this afternoon but she's wondering what she can do to help with the cough or pain in her chest. She says she is already doing Vicks rub and taking Mucinex. This nurse suggested per general nursing knowledge that she can take a warm shower or when out of the shower turn it to hot and close the bathroom door and inhale the steam to relax the airway. Suggested cough drops or lozenges, warm towel to chest or boiling water and inhaling the steam. She verbalized understanding and says she will try these things and had no further questions. Does the patient have any new or worsening symptoms? ---Yes Will a triage be completed? ---No Select reason for no triage. ---Patient declined Please document clinical information provided  and list any resource used. ---Already has appointment for this afternoon. Guidelines Guideline Title Affirmed Question Affirmed Notes Final Disposition User Clinical Call Reed Pandyamsey, RN, Amy

## 2016-12-22 NOTE — Telephone Encounter (Signed)
Noted  

## 2017-02-15 ENCOUNTER — Telehealth: Payer: Self-pay | Admitting: Family

## 2017-02-15 DIAGNOSIS — E039 Hypothyroidism, unspecified: Secondary | ICD-10-CM

## 2017-02-16 NOTE — Telephone Encounter (Signed)
Rec'd call from pharmacy stating the directions on the levothyroxine was not clear needing clarification. Verified chart ? If she is taking 200 mcg for 6 days, and then 400 mg 1 day of the week. Tried calling pt to verify how she is taking the medication had to leave msg for her to give me a call back...Raechel Chute

## 2017-02-17 ENCOUNTER — Other Ambulatory Visit: Payer: Self-pay | Admitting: Family

## 2017-02-17 DIAGNOSIS — K2100 Gastro-esophageal reflux disease with esophagitis, without bleeding: Secondary | ICD-10-CM

## 2017-03-25 ENCOUNTER — Other Ambulatory Visit: Payer: Self-pay | Admitting: Family

## 2017-03-25 DIAGNOSIS — E039 Hypothyroidism, unspecified: Secondary | ICD-10-CM

## 2017-05-02 ENCOUNTER — Telehealth: Payer: Self-pay | Admitting: Family

## 2017-05-02 DIAGNOSIS — E039 Hypothyroidism, unspecified: Secondary | ICD-10-CM

## 2017-06-01 ENCOUNTER — Telehealth: Payer: Self-pay | Admitting: Family

## 2017-06-01 ENCOUNTER — Other Ambulatory Visit: Payer: Self-pay | Admitting: Family

## 2017-06-01 DIAGNOSIS — E039 Hypothyroidism, unspecified: Secondary | ICD-10-CM

## 2017-06-01 DIAGNOSIS — F411 Generalized anxiety disorder: Secondary | ICD-10-CM

## 2017-06-01 MED ORDER — LEVOTHYROXINE SODIUM 200 MCG PO TABS: ORAL_TABLET | ORAL | 0 refills | Status: DC

## 2017-06-01 MED ORDER — CITALOPRAM HYDROBROMIDE 20 MG PO TABS
20.0000 mg | ORAL_TABLET | Freq: Every day | ORAL | 0 refills | Status: AC
Start: 2017-06-01 — End: ?

## 2017-06-01 NOTE — Telephone Encounter (Signed)
Medication sent.

## 2017-06-01 NOTE — Telephone Encounter (Signed)
Pt would like a refill of citalopram (CELEXA) 20 MG tablet   [facility] on [address]

## 2017-06-01 NOTE — Telephone Encounter (Signed)
Pt called in and needs this refill asap , [place]

## 2017-06-01 NOTE — Addendum Note (Signed)
Addended by: Mercer PodWRENN, Vickie Melnik E on: 06/01/2017 04:13 PM   Modules accepted: Orders

## 2017-06-22 ENCOUNTER — Other Ambulatory Visit: Payer: Self-pay | Admitting: Family

## 2017-06-22 DIAGNOSIS — E039 Hypothyroidism, unspecified: Secondary | ICD-10-CM

## 2019-08-06 ENCOUNTER — Encounter: Payer: Self-pay | Admitting: Gynecology
